# Patient Record
Sex: Female | Born: 1975
Health system: Southern US, Community
[De-identification: ages and names within clinical notes are randomized; demographics above are authoritative.]

## PROBLEM LIST (undated history)

## (undated) DIAGNOSIS — R109 Unspecified abdominal pain: Secondary | ICD-10-CM

## (undated) DIAGNOSIS — R5383 Other fatigue: Secondary | ICD-10-CM

## (undated) DIAGNOSIS — L578 Other skin changes due to chronic exposure to nonionizing radiation: Secondary | ICD-10-CM

## (undated) DIAGNOSIS — J45909 Unspecified asthma, uncomplicated: Secondary | ICD-10-CM

## (undated) DIAGNOSIS — F329 Major depressive disorder, single episode, unspecified: Secondary | ICD-10-CM

## (undated) DIAGNOSIS — Z0000 Encounter for general adult medical examination without abnormal findings: Secondary | ICD-10-CM

## (undated) DIAGNOSIS — F4321 Adjustment disorder with depressed mood: Secondary | ICD-10-CM

## (undated) DIAGNOSIS — E119 Type 2 diabetes mellitus without complications: Secondary | ICD-10-CM

## (undated) DIAGNOSIS — J392 Other diseases of pharynx: Secondary | ICD-10-CM

## (undated) DIAGNOSIS — B019 Varicella without complication: Secondary | ICD-10-CM

## (undated) DIAGNOSIS — E785 Hyperlipidemia, unspecified: Secondary | ICD-10-CM

## (undated) DIAGNOSIS — M4310 Spondylolisthesis, site unspecified: Secondary | ICD-10-CM

## (undated) DIAGNOSIS — F419 Anxiety disorder, unspecified: Secondary | ICD-10-CM

## (undated) DIAGNOSIS — T7840XA Allergy, unspecified, initial encounter: Secondary | ICD-10-CM

## (undated) DIAGNOSIS — G4700 Insomnia, unspecified: Secondary | ICD-10-CM

## (undated) DIAGNOSIS — R300 Dysuria: Secondary | ICD-10-CM

## (undated) DIAGNOSIS — Z124 Encounter for screening for malignant neoplasm of cervix: Secondary | ICD-10-CM

## (undated) HISTORY — DX: Other skin changes due to chronic exposure to nonionizing radiation: L57.8

## (undated) HISTORY — DX: Encounter for general adult medical examination without abnormal findings: Z00.00

## (undated) HISTORY — DX: Allergy, unspecified, initial encounter: T78.40XA

## (undated) HISTORY — DX: Major depressive disorder, single episode, unspecified: F32.9

## (undated) HISTORY — DX: Unspecified asthma, uncomplicated: J45.909

## (undated) HISTORY — DX: Dysuria: R30.0

## (undated) HISTORY — DX: Spondylolisthesis, site unspecified: M43.10

## (undated) HISTORY — DX: Type 2 diabetes mellitus without complications: E11.9

## (undated) HISTORY — DX: Other fatigue: R53.83

## (undated) HISTORY — DX: Varicella without complication: B01.9

## (undated) HISTORY — DX: Adjustment disorder with depressed mood: F43.21

## (undated) HISTORY — DX: Anxiety disorder, unspecified: F41.9

## (undated) HISTORY — DX: Other diseases of pharynx: J39.2

## (undated) HISTORY — DX: Encounter for screening for malignant neoplasm of cervix: Z12.4

## (undated) HISTORY — DX: Insomnia, unspecified: G47.00

## (undated) HISTORY — DX: Unspecified abdominal pain: R10.9

## (undated) HISTORY — DX: Hyperlipidemia, unspecified: E78.5

---

## 2007-02-02 HISTORY — PX: OTHER SURGICAL HISTORY: SHX169

## 2008-02-02 DIAGNOSIS — E119 Type 2 diabetes mellitus without complications: Secondary | ICD-10-CM

## 2008-02-02 HISTORY — DX: Type 2 diabetes mellitus without complications: E11.9

## 2008-09-01 HISTORY — PX: FOOT SURGERY: SHX648

## 2010-02-01 HISTORY — PX: DILATION AND CURETTAGE OF UTERUS: SHX78

## 2010-02-01 HISTORY — PX: OTHER SURGICAL HISTORY: SHX169

## 2013-12-06 ENCOUNTER — Ambulatory Visit (INDEPENDENT_AMBULATORY_CARE_PROVIDER_SITE_OTHER): Payer: 59 | Admitting: Family Medicine

## 2013-12-06 ENCOUNTER — Encounter: Payer: Self-pay | Admitting: Family Medicine

## 2013-12-06 DIAGNOSIS — G4700 Insomnia, unspecified: Secondary | ICD-10-CM

## 2013-12-06 DIAGNOSIS — R8290 Unspecified abnormal findings in urine: Secondary | ICD-10-CM

## 2013-12-06 DIAGNOSIS — B019 Varicella without complication: Secondary | ICD-10-CM | POA: Insufficient documentation

## 2013-12-06 DIAGNOSIS — Z0000 Encounter for general adult medical examination without abnormal findings: Secondary | ICD-10-CM

## 2013-12-06 DIAGNOSIS — E785 Hyperlipidemia, unspecified: Secondary | ICD-10-CM

## 2013-12-06 DIAGNOSIS — M4310 Spondylolisthesis, site unspecified: Secondary | ICD-10-CM

## 2013-12-06 DIAGNOSIS — R300 Dysuria: Secondary | ICD-10-CM

## 2013-12-06 DIAGNOSIS — J392 Other diseases of pharynx: Secondary | ICD-10-CM

## 2013-12-06 MED ORDER — HYOSCYAMINE SULFATE 0.125 MG SL SUBL: 0.1250 mg | SUBLINGUAL_TABLET | Freq: Four times a day (QID) | SUBLINGUAL | Status: DC | PRN

## 2013-12-06 MED ORDER — PARAGARD INTRAUTERINE COPPER IU IUD: 1.0000 | INTRAUTERINE_SYSTEM | Freq: Once | INTRAUTERINE | Status: DC

## 2013-12-06 MED ORDER — ALPRAZOLAM 0.25 MG PO TABS: 0.2500 mg | ORAL_TABLET | Freq: Two times a day (BID) | ORAL | Status: DC | PRN

## 2013-12-06 NOTE — Patient Instructions (Addendum)
Lactaid enzymes Probiotics daily [place] milk   Lexapro daily?  Curcumen/Turmeric caps daily Salon Pas/patches   Heartburn Heartburn is a painful, burning sensation in the chest. It may feel worse in certain positions, such as lying down or bending over. It is caused by stomach acid backing up into the tube that carries food from the mouth down to the stomach (lower esophagus).  CAUSES   Large meals.  Certain foods and drinks.  Exercise.  Increased acid production.  Being overweight or obese.  Certain medicines. SYMPTOMS   Burning pain in the chest or lower throat.  Bitter taste in the mouth.  Coughing. DIAGNOSIS  If the usual treatments for heartburn do not improve your symptoms, then tests may be done to see if there is another condition present. Possible tests may include:  X-rays.  Endoscopy. This is when a tube with a light and a camera on the end is used to examine the esophagus and the stomach.  A test to measure the amount of acid in the esophagus (pH test).  A test to see if the esophagus is working properly (esophageal manometry).  Blood, breath, or stool tests to check for bacteria that cause ulcers. TREATMENT   Your caregiver may tell you to use certain over-the-counter medicines (antacids, acid reducers) for mild heartburn.  Your caregiver may prescribe medicines to decrease the acid in your stomach or protect your stomach lining.  Your caregiver may recommend certain diet changes.  For severe cases, your caregiver may recommend that the head of your bed be elevated on blocks. (Sleeping with more pillows is not an effective treatment as it only changes the position of your head and does not improve the main problem of stomach acid refluxing into the esophagus.) HOME CARE INSTRUCTIONS   Take all medicines as directed by your caregiver.  Raise the head of your bed by putting blocks under the legs if instructed to by your caregiver.  Do not  exercise right after eating.  Avoid eating 2 or 3 hours before bed. Do not lie down right after eating.  Eat small meals throughout the day instead of 3 large meals.  Stop smoking if you smoke.  Maintain a healthy weight.  Identify foods and beverages that make your symptoms worse and avoid them. Foods you may want to avoid include:  Peppers.  Chocolate.  High-fat foods, including fried foods.  Spicy foods.  Garlic and onions.  Citrus fruits, including oranges, grapefruit, lemons, and limes.  Food containing tomatoes or tomato products.  Mint.  Carbonated drinks, caffeinated drinks, and alcohol.  Vinegar. SEEK IMMEDIATE MEDICAL CARE IF:  You have severe chest pain that goes down your arm or into your jaw or neck.  You feel sweaty, dizzy, or lightheaded.  You are short of breath.  You vomit blood.  You have difficulty or pain with swallowing.  You have bloody or black, tarry stools.  You have episodes of heartburn more than 3 times a week for more than 2 weeks. MAKE SURE YOU:  Understand these instructions.  Will watch your condition.  Will get help right away if you are not doing well or get worse. Document Released: 06/06/2008 Document Revised: 04/12/2011 Document Reviewed: 07/05/2010 [facility] Patient Information 2015 [place], [place]. This information is not intended to replace advice given to you by your health care provider. Make sure you discuss any questions you have with your health care provider.

## 2013-12-06 NOTE — Progress Notes (Signed)
Pre visit review using our clinic review tool, if applicable. No additional management support is needed unless otherwise documented below in the visit note. 

## 2013-12-07 LAB — URINALYSIS, ROUTINE W REFLEX MICROSCOPIC
Bilirubin Urine: NEGATIVE
KETONES UR: NEGATIVE
Nitrite: NEGATIVE
SPECIFIC GRAVITY, URINE: 1.01 (ref 1.000–1.030)
Total Protein, Urine: NEGATIVE
UROBILINOGEN UA: 0.2 (ref 0.0–1.0)
Urine Glucose: NEGATIVE
pH: 6 (ref 5.0–8.0)

## 2013-12-07 LAB — LIPID PANEL
CHOL/HDL RATIO: 3
Cholesterol: 192 mg/dL (ref 0–200)
HDL: 72.4 mg/dL (ref >39.00)
LDL CALC: 105 mg/dL — AB (ref 0–99)
NonHDL: 119.6
Triglycerides: 74 mg/dL (ref 0.0–149.0)
VLDL: 14.8 mg/dL (ref 0.0–40.0)

## 2013-12-07 LAB — HEPATIC FUNCTION PANEL
ALBUMIN: 4.1 g/dL (ref 3.5–5.2)
ALT: 18 U/L (ref 0–35)
AST: 25 U/L (ref 0–37)
Alkaline Phosphatase: 77 U/L (ref 39–117)
Bilirubin, Direct: 0.1 mg/dL (ref 0.0–0.3)
TOTAL PROTEIN: 7.8 g/dL (ref 6.0–8.3)
Total Bilirubin: 0.5 mg/dL (ref 0.2–1.2)

## 2013-12-07 LAB — CBC
HCT: 42.1 % (ref 36.0–46.0)
HEMOGLOBIN: 13.6 g/dL (ref 12.0–15.0)
MCHC: 32.4 g/dL (ref 30.0–36.0)
MCV: 91.3 fl (ref 78.0–100.0)
PLATELETS: 302 10*3/uL (ref 150.0–400.0)
RBC: 4.61 Mil/uL (ref 3.87–5.11)
RDW: 14.4 % (ref 11.5–15.5)
WBC: 7.5 10*3/uL (ref 4.0–10.5)

## 2013-12-07 LAB — RENAL FUNCTION PANEL
ALBUMIN: 4.1 g/dL (ref 3.5–5.2)
BUN: 15 mg/dL (ref 6–23)
CO2: 28 meq/L (ref 19–32)
Calcium: 9.5 mg/dL (ref 8.4–10.5)
Chloride: 105 mEq/L (ref 96–112)
Creatinine, Ser: 0.6 mg/dL (ref 0.4–1.2)
GFR: 128.93 mL/min (ref >60.00)
Glucose, Bld: 78 mg/dL (ref 70–99)
Phosphorus: 3.5 mg/dL (ref 2.3–4.6)
Potassium: 3.7 mEq/L (ref 3.5–5.1)
Sodium: 141 mEq/L (ref 135–145)

## 2013-12-07 LAB — TSH: TSH: 1.22 u[IU]/mL (ref 0.35–4.50)

## 2013-12-09 ENCOUNTER — Encounter: Payer: Self-pay | Admitting: Family Medicine

## 2013-12-09 DIAGNOSIS — R300 Dysuria: Secondary | ICD-10-CM

## 2013-12-09 DIAGNOSIS — J392 Other diseases of pharynx: Secondary | ICD-10-CM | POA: Insufficient documentation

## 2013-12-09 DIAGNOSIS — E785 Hyperlipidemia, unspecified: Secondary | ICD-10-CM

## 2013-12-09 DIAGNOSIS — G4700 Insomnia, unspecified: Secondary | ICD-10-CM

## 2013-12-09 DIAGNOSIS — M4310 Spondylolisthesis, site unspecified: Secondary | ICD-10-CM

## 2013-12-09 HISTORY — DX: Insomnia, unspecified: G47.00

## 2013-12-09 HISTORY — DX: Dysuria: R30.0

## 2013-12-09 HISTORY — DX: Other diseases of pharynx: J39.2

## 2013-12-09 HISTORY — DX: Hyperlipidemia, unspecified: E78.5

## 2013-12-09 HISTORY — DX: Spondylolisthesis, site unspecified: M43.10

## 2013-12-09 NOTE — Assessment & Plan Note (Signed)
Intermittent back pain, Encouraged moist heat and gentle stretching as tolerated. May try NSAIDs and prescription meds as directed and report if symptoms worsen or seek immediate care

## 2013-12-09 NOTE — Assessment & Plan Note (Signed)
Urinalysis questionable, will have her return to leave urine culture if still symptomatic. Encouraged probiotics and adequate hydration

## 2013-12-09 NOTE — Assessment & Plan Note (Signed)
Encouraged good sleep hygiene such as dark, quiet room. No blue/green glowing lights such as computer screens in bedroom. No alcohol or stimulants in evening. Cut down on caffeine as able. Regular exercise is helpful but not just prior to bed time. Has been using low dose Melatonin may try up to 10 mg prn

## 2013-12-09 NOTE — Assessment & Plan Note (Signed)
Encouraged heart healthy diet, increase exercise, avoid trans fats, consider a krill oil cap daily 

## 2013-12-09 NOTE — Progress Notes (Signed)
Patient ID: Cynthia Blanchard, female   DOB: September 13, 1975, 38 y.o.   MRN: [id number] Rejina Odle [id number] [birth year] 12/09/2013      Progress Note New Patient  Subjective  Chief Complaint  Chief Complaint  Patient presents with  . Establish Care    new patient    HPI  Patient is a 38 year old female in today for routine medical care. Patient is in today to establish care. She has several young kids at home and acknowledges a great deal of stress as a result. She works very infrequently as a [employer] PT. She is noting some trouble sleeping and ongoing back pain. No recent illness. Is noting some throat irritation. No reflux or allergic symptoms routinely. Denies CP/palp/SOB/HA/congestion/fevers/GI or GU c/o. Taking meds as prescribed  Past Medical History  Diagnosis Date  . Chicken pox as a child  . Asthma     as a child  . Diabetes mellitus without complication 7342    gestational diabetes- first pregnancy- not in the last 2 pregnancies  . Insomnia 12/09/2013  . Spondylolisthesis 12/09/2013  . Dysuria 12/09/2013  . Hyperlipidemia 12/09/2013  . Throat irritation 12/09/2013    Past Surgical History  Procedure Laterality Date  . Foot surgery  8-10    right- cyst- size of a golf ball  . Tube ressected  2009    right tube  . Tube removed  2012    right tube  . Dilation and curettage of uterus  2012    Family History  Problem Relation Age of Onset  . Hypertension Mother   . Obesity Father   . Depression Father   . Cancer Maternal Grandmother     skin  . Other Maternal Grandmother     valve replacement  . Cancer Maternal Grandfather     colon  . Diabetes Paternal Grandmother     type 2  . Stroke Paternal Grandmother   . Cancer Paternal Grandfather     prostate  . Mental illness Brother   . HIV Brother   . Drug abuse Brother     History   Social History  . Marital Status: Married    Spouse Name: N/A    Number of Children: N/A  . Years of Education: N/A   Occupational  History  . Not on file.   Social History Main Topics  . Smoking status: Never Smoker   . Smokeless tobacco: Never Used  . Alcohol Use: 0.0 oz/week    0 Not specified per week     Comment: glass of wine every night  . Drug Use: No  . Sexual Activity:    Partners: Male     Comment: live with husband and 3 children. 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 Not on file   Social History Narrative    No current outpatient prescriptions on file prior to visit.   No current facility-administered medications on file prior to visit.    Allergies  Allergen Reactions  . Penicillins Hives    Review of Systems  Review of Systems  Constitutional: Negative for fever, chills and malaise/fatigue.  HENT: Negative for congestion, hearing loss and nosebleeds.   Eyes: Negative for discharge.  Respiratory: Negative for cough, sputum production, shortness of breath and wheezing.   Cardiovascular: Negative for chest pain, palpitations and leg swelling.  Gastrointestinal: Negative for heartburn, nausea, vomiting, abdominal pain, diarrhea, constipation and blood in stool.  Genitourinary: Negative for dysuria, urgency,  frequency and hematuria.  Musculoskeletal: Negative for myalgias, back pain and falls.  Skin: Negative for rash.  Neurological: Negative for dizziness, tremors, sensory change, focal weakness, loss of consciousness, weakness and headaches.  Endo/Heme/Allergies: Negative for polydipsia. Does not bruise/bleed easily.  Psychiatric/Behavioral: Negative for depression and suicidal ideas. The patient is not nervous/anxious and does not have insomnia.     Objective  BP 133/76 mmHg  Pulse 69  Temp(Src) 98 F (36.7 C) (Oral)  Ht 5' 6.5" (1.689 m)  Wt 141 lb (63.957 kg)  BMI 22.42 kg/m2  SpO2 98%  LMP 01/16/2012  Physical Exam  Physical Exam  Constitutional: She is oriented to person, place, and time and well-developed, well-nourished,  and in no distress. No distress.  HENT:  Head: Normocephalic and atraumatic.  Right Ear: External ear normal.  Left Ear: External ear normal.  Nose: Nose normal.  Mouth/Throat: Oropharynx is clear and moist. No oropharyngeal exudate.  Eyes: Conjunctivae are normal. Pupils are equal, round, and reactive to light. Right eye exhibits no discharge. Left eye exhibits no discharge. No scleral icterus.  Neck: Normal range of motion. Neck supple. No thyromegaly present.  Cardiovascular: Normal rate, regular rhythm, normal heart sounds and intact distal pulses.   No murmur heard. Pulmonary/Chest: Effort normal and breath sounds normal. No respiratory distress. She has no wheezes. She has no rales.  Abdominal: Soft. Bowel sounds are normal. She exhibits no distension and no mass. There is no tenderness.  Musculoskeletal: Normal range of motion. She exhibits no edema or tenderness.  Lymphadenopathy:    She has no cervical adenopathy.  Neurological: She is alert and oriented to person, place, and time. She has normal reflexes. No cranial nerve deficit. Coordination normal.  Skin: Skin is warm and dry. No rash noted. She is not diaphoretic.  Psychiatric: Mood, memory and affect normal.       Assessment & Plan  Spondylolisthesis Intermittent back pain, Encouraged moist heat and gentle stretching as tolerated. May try NSAIDs and prescription meds as directed and report if symptoms worsen or seek immediate care  Hyperlipidemia Encouraged heart healthy diet, increase exercise, avoid trans fats, consider a krill oil cap daily  Insomnia Encouraged good sleep hygiene such as dark, quiet room. No blue/green glowing lights such as computer screens in bedroom. No alcohol or stimulants in evening. Cut down on caffeine as able. Regular exercise is helpful but not just prior to bed time. Has been using low dose Melatonin may try up to 10 mg prn  Dysuria Urinalysis questionable, will have her return to  leave urine culture if still symptomatic. Encouraged probiotics and adequate hydration  Throat irritation Likely multifactorial. Encouraged to consider antihistamines and H2 blocker. Avoid offending foods and report if worse, start a probiotics

## 2013-12-09 NOTE — Assessment & Plan Note (Signed)
Likely multifactorial. Encouraged to consider antihistamines and H2 blocker. Avoid offending foods and report if worse, start a probiotics

## 2013-12-10 NOTE — Addendum Note (Signed)
Addended by: Varney Daily on: 12/10/2013 12:02 PM   Modules accepted: Orders

## 2014-03-08 ENCOUNTER — Ambulatory Visit (INDEPENDENT_AMBULATORY_CARE_PROVIDER_SITE_OTHER): Payer: 59 | Admitting: Family Medicine

## 2014-03-08 ENCOUNTER — Other Ambulatory Visit (HOSPITAL_COMMUNITY)
Admission: RE | Admit: 2014-03-08 | Discharge: 2014-03-08 | Disposition: A | Discharge status: Home / Self Care | Payer: 59 | Source: Ambulatory Visit | Attending: Family Medicine | Admitting: Family Medicine

## 2014-03-08 ENCOUNTER — Encounter: Payer: Self-pay | Admitting: Family Medicine

## 2014-03-08 VITALS — BP 108/67 | HR 57 | Temp 98.0°F | Ht 66.5 in | Wt 137.8 lb

## 2014-03-08 DIAGNOSIS — E785 Hyperlipidemia, unspecified: Secondary | ICD-10-CM

## 2014-03-08 DIAGNOSIS — N760 Acute vaginitis: Secondary | ICD-10-CM

## 2014-03-08 DIAGNOSIS — G4700 Insomnia, unspecified: Secondary | ICD-10-CM

## 2014-03-08 DIAGNOSIS — N390 Urinary tract infection, site not specified: Secondary | ICD-10-CM

## 2014-03-08 MED ORDER — HYOSCYAMINE SULFATE 0.125 MG SL SUBL: 0.1250 mg | SUBLINGUAL_TABLET | Freq: Four times a day (QID) | SUBLINGUAL | Status: DC | PRN

## 2014-03-08 NOTE — Progress Notes (Signed)
Pre visit review using our clinic review tool, if applicable. No additional management support is needed unless otherwise documented below in the visit note. 

## 2014-03-09 LAB — URINALYSIS
Bilirubin Urine: NEGATIVE
Glucose, UA: NEGATIVE mg/dL
HGB URINE DIPSTICK: NEGATIVE
KETONES UR: NEGATIVE mg/dL
LEUKOCYTES UA: NEGATIVE
NITRITE: NEGATIVE
PROTEIN: NEGATIVE mg/dL
Specific Gravity, Urine: 1.01 (ref 1.005–1.030)
Urobilinogen, UA: 0.2 mg/dL (ref 0.0–1.0)
pH: 7 (ref 5.0–8.0)

## 2014-03-10 LAB — URINE CULTURE: Colony Count: 2000

## 2014-03-13 LAB — URINE CYTOLOGY ANCILLARY ONLY
Bacterial vaginitis: NEGATIVE
Candida vaginitis: NEGATIVE

## 2014-03-17 ENCOUNTER — Encounter: Payer: Self-pay | Admitting: Family Medicine

## 2014-03-17 NOTE — Assessment & Plan Note (Signed)
Encouraged heart healthy diet, increase exercise, avoid trans fats, consider a krill oil cap daily 

## 2014-03-17 NOTE — Progress Notes (Signed)
Cynthia Blanchard  [id number] 1975/03/03 03/17/2014      Progress Note-Follow Up  Subjective  Chief Complaint  Chief Complaint  Patient presents with  . Follow-up    HPI  Patient is a 39 y.o. female in today for routine medical care. Patient is in today for follow-up generally doing well. Uses alprazolam intermittently for sleep but otherwise has not needed it. No recent significant anxiety or depression. No recent illness. Denies CP/palp/SOB/HA/congestion/fevers/GI or GU c/o. Taking meds as prescribed  Past Medical History  Diagnosis Date  . Chicken pox as a child  . Asthma     as a child  . Diabetes mellitus without complication 0932    gestational diabetes- first pregnancy- not in the last 2 pregnancies  . Insomnia 12/09/2013  . Spondylolisthesis 12/09/2013  . Dysuria 12/09/2013  . Hyperlipidemia 12/09/2013  . Throat irritation 12/09/2013    Past Surgical History  Procedure Laterality Date  . Foot surgery  8-10    right- cyst- size of a golf ball  . Tube ressected  2009    right tube  . Tube removed  2012    right tube  . Dilation and curettage of uterus  2012    Family History  Problem Relation Age of Onset  . Hypertension Mother   . Obesity Father   . Depression Father   . Cancer Maternal Grandmother     skin  . Other Maternal Grandmother     valve replacement  . Cancer Maternal Grandfather     colon  . Diabetes Paternal Grandmother     type 2  . Stroke Paternal Grandmother   . Cancer Paternal Grandfather     prostate  . Mental illness Brother   . HIV Brother   . Drug abuse Brother     History   Social History  . Marital Status: Married    Spouse Name: N/A  . Number of Children: N/A  . Years of Education: N/A   Occupational History  . Not on file.   Social History Main Topics  . Smoking status: Never Smoker   . Smokeless tobacco: Never Used  . Alcohol Use: 0.0 oz/week    0 Standard drinks or equivalent per week     Comment: glass of wine  every night  . Drug Use: No  . Sexual Activity:    Partners: Male     Comment: live with husband and 3 children. 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 Not on file   Social History Narrative    Current Outpatient Prescriptions on File Prior to Visit  Medication Sig Dispense Refill  . ALPRAZolam (XANAX) 0.25 MG tablet Take 1 tablet (0.25 mg total) by mouth 2 (two) times daily as needed for anxiety. 20 tablet 2  . PARAGARD INTRAUTERINE COPPER IUD IUD 1 each by Intrauterine route once. November 2014  0  . Probiotic Product (PROBIOTIC DAILY PO) Take by mouth.     No current facility-administered medications on file prior to visit.    Allergies  Allergen Reactions  . Latex Hives  . Penicillins Hives    Review of Systems  Review of Systems  Constitutional: Negative for fever and malaise/fatigue.  HENT: Negative for congestion.   Eyes: Negative for discharge.  Respiratory: Negative for shortness of breath.   Cardiovascular: Negative for chest pain, palpitations and leg swelling.  Gastrointestinal: Negative for nausea, abdominal pain and diarrhea.  Genitourinary: Negative for dysuria.  Musculoskeletal: Negative for falls.  Skin: Negative for rash.  Neurological: Negative for loss of consciousness and headaches.  Endo/Heme/Allergies: Negative for polydipsia.  Psychiatric/Behavioral: Negative for depression and suicidal ideas. The patient is not nervous/anxious and does not have insomnia.     Objective  BP 108/67 mmHg  Pulse 57  Temp(Src) 98 F (36.7 C) (Oral)  Ht 5' 6.5" (1.689 m)  Wt 137 lb 12.8 oz (62.506 kg)  BMI 21.91 kg/m2  SpO2 99%  Physical Exam  Physical Exam  Constitutional: She is oriented to person, place, and time and well-developed, well-nourished, and in no distress. No distress.  HENT:  Head: Normocephalic and atraumatic.  Eyes: Conjunctivae are normal.  Neck: Neck supple. No thyromegaly present.    Cardiovascular: Normal rate, regular rhythm and normal heart sounds.   No murmur heard. Pulmonary/Chest: Effort normal and breath sounds normal. She has no wheezes.  Abdominal: She exhibits no distension and no mass.  Musculoskeletal: She exhibits no edema.  Lymphadenopathy:    She has no cervical adenopathy.  Neurological: She is alert and oriented to person, place, and time.  Skin: Skin is warm and dry. No rash noted. She is not diaphoretic.  Psychiatric: Memory, affect and judgment normal.    Lab Results  Component Value Date   TSH 1.22 12/06/2013   Lab Results  Component Value Date   WBC 7.5 12/06/2013   HGB 13.6 12/06/2013   HCT 42.1 12/06/2013   MCV 91.3 12/06/2013   PLT 302.0 12/06/2013   Lab Results  Component Value Date   CREATININE 0.6 12/06/2013   BUN 15 12/06/2013   NA 141 12/06/2013   K 3.7 12/06/2013   CL 105 12/06/2013   CO2 28 12/06/2013   Lab Results  Component Value Date   ALT 18 12/06/2013   AST 25 12/06/2013   ALKPHOS 77 12/06/2013   BILITOT 0.5 12/06/2013   Lab Results  Component Value Date   CHOL 192 12/06/2013   Lab Results  Component Value Date   HDL 72.40 12/06/2013   Lab Results  Component Value Date   LDLCALC 105* 12/06/2013   Lab Results  Component Value Date   TRIG 74.0 12/06/2013   Lab Results  Component Value Date   CHOLHDL 3 12/06/2013     Assessment & Plan  Insomnia Using Alprazolam prn may continue the same. Encouraged good sleep hygiene such as dark, quiet room. No blue/green glowing lights such as computer screens in bedroom. No alcohol or stimulants in evening. Cut down on caffeine as able. Regular exercise is helpful but not just prior to bed time.    Hyperlipidemia Encouraged heart healthy diet, increase exercise, avoid trans fats, consider a krill oil cap daily

## 2014-03-17 NOTE — Assessment & Plan Note (Signed)
Using Alprazolam prn may continue the same. Encouraged good sleep hygiene such as dark, quiet room. No blue/green glowing lights such as computer screens in bedroom. No alcohol or stimulants in evening. Cut down on caffeine as able. Regular exercise is helpful but not just prior to bed time.

## 2014-08-13 ENCOUNTER — Telehealth: Payer: Self-pay | Admitting: Family Medicine

## 2014-08-13 NOTE — Telephone Encounter (Signed)
pre visit letter mailed 08/13/14

## 2014-08-14 ENCOUNTER — Encounter: Payer: Self-pay | Admitting: Family Medicine

## 2014-08-14 ENCOUNTER — Encounter: Payer: Self-pay | Admitting: *Deleted

## 2014-08-14 ENCOUNTER — Ambulatory Visit (INDEPENDENT_AMBULATORY_CARE_PROVIDER_SITE_OTHER): Payer: Commercial Managed Care - HMO | Admitting: Family Medicine

## 2014-08-14 VITALS — BP 113/77 | HR 93 | Temp 102.3°F | Resp 16 | Ht 66.5 in | Wt 136.0 lb

## 2014-08-14 DIAGNOSIS — R509 Fever, unspecified: Secondary | ICD-10-CM

## 2014-08-14 DIAGNOSIS — J989 Respiratory disorder, unspecified: Secondary | ICD-10-CM | POA: Diagnosis not present

## 2014-08-14 LAB — POCT RAPID STREP A (OFFICE): Rapid Strep A Screen: NEGATIVE

## 2014-08-14 MED ORDER — HYDROCODONE-HOMATROPINE 5-1.5 MG/5ML PO SYRP
ORAL_SOLUTION | ORAL | Status: DC
Start: 2014-08-14 — End: 2014-12-25

## 2014-08-14 MED ORDER — MAGIC MOUTHWASH: 5.0000 mL | Freq: Four times a day (QID) | ORAL | Status: DC | PRN

## 2014-08-14 MED ORDER — CLARITHROMYCIN 500 MG PO TABS: ORAL_TABLET | ORAL | Status: DC

## 2014-08-14 NOTE — Progress Notes (Signed)
Pre visit review using our clinic review tool, if applicable. No additional management support is needed unless otherwise documented below in the visit note. 

## 2014-08-14 NOTE — Addendum Note (Signed)
Addended by: Tammi Sou on: 08/14/2014 02:36 PM   Modules accepted: Orders

## 2014-08-14 NOTE — Progress Notes (Signed)
OFFICE VISIT  08/14/2014   CC:  Chief Complaint  Patient presents with  . URI    x 3 days   HPI:    Patient is a 39 y.o. Caucasian female who presents for fever. Onset abruptly 4d/a of fever, HA, neck and upper shoulders feel painful/burning, productive cough, nausea, fatigue/bloated.  No SOB or vomiting. PO intake was poor but picking up today. No rash. Has 3 small kids, 2 of whom had fever/URI/cough recently.   Past Medical History  Diagnosis Date  . Chicken pox as a child  . Asthma     as a child  . Diabetes mellitus without complication 9150    gestational diabetes- first pregnancy- not in the last 2 pregnancies  . Insomnia 12/09/2013  . Spondylolisthesis 12/09/2013  . Dysuria 12/09/2013  . Hyperlipidemia 12/09/2013  . Throat irritation 12/09/2013    Past Surgical History  Procedure Laterality Date  . Foot surgery  8-10    right- cyst- size of a golf ball  . Tube ressected  2009    right tube  . Tube removed  2012    right tube  . Dilation and curettage of uterus  2012    Outpatient Prescriptions Prior to Visit  Medication Sig Dispense Refill  . ALPRAZolam (XANAX) 0.25 MG tablet Take 1 tablet (0.25 mg total) by mouth 2 (two) times daily as needed for anxiety. 20 tablet 2  . hyoscyamine (LEVSIN SL) 0.125 MG SL tablet Place 1 tablet (0.125 mg total) under the tongue every 6 (six) hours as needed. 30 tablet 1  . Probiotic Product (PROBIOTIC DAILY PO) Take by mouth.    Marland Kitchen PARAGARD INTRAUTERINE COPPER IUD IUD 1 each by Intrauterine route once. November 2014 (Patient not taking: Reported on 08/14/2014)  0   No facility-administered medications prior to visit.    Allergies  Allergen Reactions  . Latex Hives  . Penicillins Hives    ROS As per HPI  PE: Blood pressure 113/77, pulse 93, temperature 102.3 F (39.1 C), temperature source Oral, resp. rate 16, height 5' 6.5" (1.689 m), weight 136 lb (61.689 kg), SpO2 95 %. Alert, tired appearing but nontoxic.  Oriented  x 4. HEENT: eyes without injection, drainage, or swelling.  Ears: EACs clear, TMs with normal light reflex and landmarks.  Nose: Clear rhinorrhea, with some dried, crusty exudate adherent to mildly injected mucosa.  No purulent d/c.  No paranasal sinus TTP.  No facial swelling.  Throat and mouth without focal lesion.  No pharyngial swelling, erythema, or exudate.   Neck: supple, no LAD.   LUNGS: CTA bilat, nonlabored resps.   CV: RRR, no m/r/g. EXT: no c/c/e SKIN: no rash  LABS:  Rapid strep NEG  IMPRESSION AND PLAN:  Febrile respiratory illness; likely viral but fevers lasting 4d. 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 Hycodan syrup 1-2 tsp bid prn cough, 240 ml. Robitussin DM during daytime. Clarithromycin 500 mg bid x 10d.  An After Visit Summary was printed and given to the patient.  FOLLOW UP: prn

## 2014-08-16 ENCOUNTER — Telehealth: Payer: Self-pay | Admitting: Family Medicine

## 2014-08-16 NOTE — Telephone Encounter (Signed)
Patient states she has a rash on her neck now that is shaped like a ring. She is wondering if she has lymes disease. Patient requesting a CB.

## 2014-08-16 NOTE — Telephone Encounter (Signed)
Pt advised and voiced understanding.  Will call Monday if no improvement to schedule ov.

## 2014-08-16 NOTE — Telephone Encounter (Signed)
Left message to call back  

## 2014-08-16 NOTE — Telephone Encounter (Signed)
Spoke to pt and she states that she noticed what looked like a insect bite on the back of her neck about 11 days ago. She stated that since then it has changed and is now like a red ring with white center, not itchy but scaly. Please advise. Thanks.

## 2014-08-16 NOTE — Telephone Encounter (Signed)
Sorry, have to see her in office in order to be able to tell what this is and what to do about it. She also has option of going to an urgent care or to our Saturday clinic at [place] 9-1 PM.-thx

## 2014-08-19 ENCOUNTER — Encounter: Payer: Self-pay | Admitting: Family Medicine

## 2014-08-19 ENCOUNTER — Ambulatory Visit (INDEPENDENT_AMBULATORY_CARE_PROVIDER_SITE_OTHER): Payer: Commercial Managed Care - HMO | Admitting: Family Medicine

## 2014-08-19 VITALS — BP 120/77 | HR 81 | Temp 97.9°F | Resp 16 | Ht 66.5 in | Wt 136.0 lb

## 2014-08-19 DIAGNOSIS — B354 Tinea corporis: Secondary | ICD-10-CM

## 2014-08-19 DIAGNOSIS — J208 Acute bronchitis due to other specified organisms: Secondary | ICD-10-CM | POA: Diagnosis not present

## 2014-08-19 NOTE — Progress Notes (Signed)
Pre visit review using our clinic review tool, if applicable. No additional management support is needed unless otherwise documented below in the visit note. 

## 2014-08-19 NOTE — Progress Notes (Signed)
OFFICE NOTE  08/19/2014  CC:  Chief Complaint  Patient presents with  . Rash  . Fatigue     HPI: Patient is a 39 y.o. Caucasian female who is here for concern about a rash on back of neck that she is concerned may be lyme dz.  She noted it start as a raised red bump, now is a circle with flaky borders and central clearing.  No itching or pain.  No tick bite was noted. Her recent febrile resp illness is improved by about 50% per pt today.  Still with some rattly cough, some "fits" of coughing.  No more fevers, and her HA's and myalgias in shoulders/neck area are improving.  Pertinent PMH:  Past medical, surgical, social, and family history reviewed and no changes are noted since last office visit.  MEDS:  Outpatient Prescriptions Prior to Visit  Medication Sig Dispense Refill  . ALPRAZolam (XANAX) 0.25 MG tablet Take 1 tablet (0.25 mg total) by mouth 2 (two) times daily as needed for anxiety. 20 tablet 2  . Alum & Mag Hydroxide-Simeth (MAGIC MOUTHWASH) SOLN Take 5 mLs by mouth 4 (four) times daily as needed for mouth pain. 100 mL 0  . clarithromycin (BIAXIN) 500 MG tablet 1 tab po bid x 10d 20 tablet 0  . HYDROcodone-homatropine (HYCODAN) 5-1.5 MG/5ML syrup 1-2 tsp po bid prn cough 240 mL 0  . hyoscyamine (LEVSIN SL) 0.125 MG SL tablet Place 1 tablet (0.125 mg total) under the tongue every 6 (six) hours as needed. 30 tablet 1  . Probiotic Product (PROBIOTIC DAILY PO) Take by mouth.     No facility-administered medications prior to visit.    PE: Blood pressure 120/77, pulse 81, temperature 97.9 F (36.6 C), temperature source Oral, resp. rate 16, height 5' 6.5" (1.689 m), weight 136 lb (61.689 kg), last menstrual period 07/27/2014, SpO2 99 %. Gen: Alert, well appearing.  Patient is oriented to person, place, time, and situation. SKIN: left post neck with nickel-sized pink oval with distinct flaky, raised border and central clearing noted. No macular rash around this.   CV: RRR, no  m/r/g.   LUNGS: CTA bilat, nonlabored resps, good aeration in all lung fields.   IMPRESSION AND PLAN:  1) Recent febrile resp illness, acute bronchitis: improving on clarithromycin and symptomatic care. Continue current meds.  Offered systemic steroid today but given her improvement at this juncture we decided to hold off right now, but if prolonged sx's occur then she'll call or return.  2) Tinea corporis: reassured pt that she does not have lyme dz. Apply lamisil cream OTC bid.  An After Visit Summary was printed and given to the patient.  FOLLOW UP: prn

## 2014-09-02 ENCOUNTER — Telehealth: Payer: Self-pay | Admitting: *Deleted

## 2014-09-02 ENCOUNTER — Encounter: Payer: Self-pay | Admitting: *Deleted

## 2014-09-02 NOTE — Telephone Encounter (Signed)
Pre-Visit Call completed with patient and chart updated.   Pre-Visit Info documented in Specialty Comments under [employer].    

## 2014-09-02 NOTE — Telephone Encounter (Signed)
Unable to reach patient at time of Pre-Visit Call.  Left message for patient to return call when available.    

## 2014-09-02 NOTE — Addendum Note (Signed)
Addended by: Leticia Penna A on: 09/02/2014 02:14 PM   Modules accepted: Orders, Medications

## 2014-09-03 ENCOUNTER — Encounter: Payer: Self-pay | Admitting: Family Medicine

## 2014-09-03 ENCOUNTER — Ambulatory Visit (INDEPENDENT_AMBULATORY_CARE_PROVIDER_SITE_OTHER): Payer: Commercial Managed Care - HMO | Admitting: Family Medicine

## 2014-09-03 ENCOUNTER — Other Ambulatory Visit (HOSPITAL_COMMUNITY)
Admission: RE | Admit: 2014-09-03 | Discharge: 2014-09-03 | Disposition: A | Discharge status: Home / Self Care | Payer: Commercial Managed Care - HMO | Source: Ambulatory Visit | Attending: Family Medicine | Admitting: Family Medicine

## 2014-09-03 VITALS — BP 92/62 | HR 74 | Temp 97.8°F | Ht 67.0 in | Wt 131.5 lb

## 2014-09-03 DIAGNOSIS — L578 Other skin changes due to chronic exposure to nonionizing radiation: Secondary | ICD-10-CM

## 2014-09-03 DIAGNOSIS — Z124 Encounter for screening for malignant neoplasm of cervix: Secondary | ICD-10-CM

## 2014-09-03 DIAGNOSIS — R109 Unspecified abdominal pain: Secondary | ICD-10-CM

## 2014-09-03 DIAGNOSIS — Z0000 Encounter for general adult medical examination without abnormal findings: Secondary | ICD-10-CM

## 2014-09-03 DIAGNOSIS — N760 Acute vaginitis: Secondary | ICD-10-CM

## 2014-09-03 DIAGNOSIS — E782 Mixed hyperlipidemia: Secondary | ICD-10-CM | POA: Diagnosis not present

## 2014-09-03 DIAGNOSIS — R51 Headache: Secondary | ICD-10-CM | POA: Diagnosis not present

## 2014-09-03 DIAGNOSIS — Z01419 Encounter for gynecological examination (general) (routine) without abnormal findings: Secondary | ICD-10-CM | POA: Diagnosis present

## 2014-09-03 DIAGNOSIS — R519 Headache, unspecified: Secondary | ICD-10-CM

## 2014-09-03 DIAGNOSIS — E785 Hyperlipidemia, unspecified: Secondary | ICD-10-CM

## 2014-09-03 DIAGNOSIS — G4700 Insomnia, unspecified: Secondary | ICD-10-CM

## 2014-09-03 HISTORY — DX: Encounter for screening for malignant neoplasm of cervix: Z12.4

## 2014-09-03 HISTORY — DX: Encounter for general adult medical examination without abnormal findings: Z00.00

## 2014-09-03 LAB — COMPREHENSIVE METABOLIC PANEL
ALBUMIN: 4.3 g/dL (ref 3.5–5.2)
ALT: 16 U/L (ref 0–35)
AST: 20 U/L (ref 0–37)
Alkaline Phosphatase: 58 U/L (ref 39–117)
BUN: 13 mg/dL (ref 6–23)
CALCIUM: 8.9 mg/dL (ref 8.4–10.5)
CHLORIDE: 103 meq/L (ref 96–112)
CO2: 32 meq/L (ref 19–32)
Creatinine, Ser: 0.6 mg/dL (ref 0.40–1.20)
GFR: 118.59 mL/min (ref >60.00)
Glucose, Bld: 78 mg/dL (ref 70–99)
Potassium: 3.6 mEq/L (ref 3.5–5.1)
Sodium: 140 mEq/L (ref 135–145)
TOTAL PROTEIN: 7 g/dL (ref 6.0–8.3)
Total Bilirubin: 0.5 mg/dL (ref 0.2–1.2)

## 2014-09-03 LAB — CBC
HCT: 38 % (ref 36.0–46.0)
Hemoglobin: 12.4 g/dL (ref 12.0–15.0)
MCHC: 32.7 g/dL (ref 30.0–36.0)
MCV: 89.1 fl (ref 78.0–100.0)
PLATELETS: 335 10*3/uL (ref 150.0–400.0)
RBC: 4.26 Mil/uL (ref 3.87–5.11)
RDW: 14.6 % (ref 11.5–15.5)
WBC: 7.7 10*3/uL (ref 4.0–10.5)

## 2014-09-03 LAB — LIPID PANEL
Cholesterol: 158 mg/dL (ref 0–200)
HDL: 59 mg/dL (ref >39.00)
LDL Cholesterol: 81 mg/dL (ref 0–99)
NonHDL: 98.56
Total CHOL/HDL Ratio: 3
Triglycerides: 89 mg/dL (ref 0.0–149.0)
VLDL: 17.8 mg/dL (ref 0.0–40.0)

## 2014-09-03 LAB — TSH: TSH: 0.93 u[IU]/mL (ref 0.35–4.50)

## 2014-09-03 NOTE — Progress Notes (Signed)
Raylei Losurdo  [id number] 1975/02/12 09/03/2014      Progress Note-Follow Up  Subjective  Chief Complaint  Chief Complaint  Patient presents with  . Annual Exam    HPI  Patient is a 39 y.o. female in today for routine medical care. Patient is in today for annual exam and fee Had an episode of feeling shaky and sweaty last week after skipping a meal. This has not been recurrent. She has occasionally had sharp abdominal pains since last visit but has responded to Hyoscyamine No bloody or tarry stool. Denies CP/palp/SOB/HA/congestion/fevers/GI or GU c/o. Taking meds as prescribed  Past Medical History  Diagnosis Date  . Chicken pox as a child  . Asthma     as a child  . Diabetes mellitus without complication 9798    gestational diabetes- first pregnancy- not in the last 2 pregnancies  . Insomnia 12/09/2013  . Spondylolisthesis 12/09/2013  . Dysuria 12/09/2013  . Hyperlipidemia 12/09/2013  . Throat irritation 12/09/2013    Past Surgical History  Procedure Laterality Date  . Foot surgery  8-10    right- cyst- size of a golf ball  . Tube ressected  2009    right tube  . Tube removed  2012    right tube  . Dilation and curettage of uterus  2012    Family History  Problem Relation Age of Onset  . Hypertension Mother   . Obesity Father   . Depression Father   . Cancer Maternal Grandmother     skin  . Other Maternal Grandmother     valve replacement  . Cancer Maternal Grandfather     colon  . Diabetes Paternal Grandmother     type 2  . Stroke Paternal Grandmother   . Cancer Paternal Grandfather     prostate  . Mental illness Brother   . HIV Brother   . Drug abuse Brother     History   Social History  . Marital Status: Married    Spouse Name: N/A  . Number of Children: N/A  . Years of Education: N/A   Occupational History  . Not on file.   Social History Main Topics  . Smoking status: Never Smoker   . Smokeless tobacco: Never Used  . Alcohol Use: 0.0  oz/week    0 Standard drinks or equivalent per week     Comment: glass of wine every night  . Drug Use: No  . Sexual Activity:    Partners: Male     Comment: live with husband and 3 children. 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 Not on file   Social History Narrative    Current Outpatient Prescriptions on File Prior to Visit  Medication Sig Dispense Refill  . ALPRAZolam (XANAX) 0.25 MG tablet Take 1 tablet (0.25 mg total) by mouth 2 (two) times daily as needed for anxiety. 20 tablet 2  . hyoscyamine (LEVSIN SL) 0.125 MG SL tablet Place 1 tablet (0.125 mg total) under the tongue every 6 (six) hours as needed. 30 tablet 1  . Probiotic Product (PROBIOTIC DAILY PO) Take by mouth.    . Alum & Mag Hydroxide-Simeth (MAGIC MOUTHWASH) SOLN Take 5 mLs by mouth 4 (four) times daily as needed for mouth pain. (Patient not taking: Reported on 09/03/2014) 100 mL 0  . HYDROcodone-homatropine (HYCODAN) 5-1.5 MG/5ML syrup 1-2 tsp po bid prn cough (Patient not taking: Reported on 09/03/2014) 240 mL 0   No  current facility-administered medications on file prior to visit.    Allergies  Allergen Reactions  . Latex Hives  . Penicillins Hives    Allergies verified: UTD   Immunization Status: Flu vaccine-- 11/01/13  Tdap-- 2010 per patient  PNA-- NA  Shingles-- NA   A/P:  Changes to FH, PSH or Personal Hx: UTD  Pap-- "many years ago" - would like at appointment  MMG-- NA  Bone Density-- NA  CCS-- NA  Review of Systems  Review of Systems  Constitutional: Negative for fever and malaise/fatigue.  HENT: Negative for congestion.   Eyes: Negative for discharge.  Respiratory: Negative for shortness of breath.   Cardiovascular: Negative for chest pain, palpitations and leg swelling.  Gastrointestinal: Positive for abdominal pain. Negative for nausea and diarrhea.       Good response to Hyoscyamine when needed  Genitourinary: Negative for dysuria.    Musculoskeletal: Negative for falls.  Skin: Negative for rash.       Verrucous lesion on foot  Neurological: Positive for tremors and seizures. Negative for loss of consciousness and headaches.       Had an episode of feeling shakey, sweaty last week after she skipped a meal.   Endo/Heme/Allergies: Negative for polydipsia.  Psychiatric/Behavioral: Negative for depression and suicidal ideas. The patient is not nervous/anxious and does not have insomnia.     Objective  BP 92/62 mmHg  Pulse 74  Temp(Src) 97.8 F (36.6 C) (Oral)  Ht 5' 7"  (1.702 m)  Wt 131 lb 8 oz (59.648 kg)  BMI 20.59 kg/m2  SpO2 98%  LMP 08/26/2014  Physical Exam  Physical Exam  Constitutional: She is oriented to person, place, and time and well-developed, well-nourished, and in no distress. No distress.  HENT:  Head: Normocephalic and atraumatic.  Right Ear: External ear normal.  Left Ear: External ear normal.  Nose: Nose normal.  Mouth/Throat: Oropharynx is clear and moist. No oropharyngeal exudate.  Eyes: Conjunctivae are normal. Pupils are equal, round, and reactive to light. Right eye exhibits no discharge. Left eye exhibits no discharge. No scleral icterus.  Neck: Normal range of motion. Neck supple. No thyromegaly present.  Cardiovascular: Normal rate, regular rhythm, normal heart sounds and intact distal pulses.   No murmur heard. Pulmonary/Chest: Effort normal and breath sounds normal. No respiratory distress. She has no wheezes. She has no rales.  Abdominal: Soft. Bowel sounds are normal. She exhibits no distension and no mass. There is no tenderness.  Musculoskeletal: Normal range of motion. She exhibits no edema or tenderness.  Lymphadenopathy:    She has no cervical adenopathy.  Neurological: She is alert and oriented to person, place, and time. She has normal reflexes. No cranial nerve deficit. Coordination normal.  Skin: Skin is warm and dry. No rash noted. She is not diaphoretic.   Psychiatric: Mood, memory and affect normal.    Lab Results  Component Value Date   TSH 1.22 12/06/2013   Lab Results  Component Value Date   WBC 7.5 12/06/2013   HGB 13.6 12/06/2013   HCT 42.1 12/06/2013   MCV 91.3 12/06/2013   PLT 302.0 12/06/2013   Lab Results  Component Value Date   CREATININE 0.6 12/06/2013   BUN 15 12/06/2013   NA 141 12/06/2013   K 3.7 12/06/2013   CL 105 12/06/2013   CO2 28 12/06/2013   Lab Results  Component Value Date   ALT 18 12/06/2013   AST 25 12/06/2013   ALKPHOS 77 12/06/2013   BILITOT  0.5 12/06/2013   Lab Results  Component Value Date   CHOL 192 12/06/2013   Lab Results  Component Value Date   HDL 72.40 12/06/2013   Lab Results  Component Value Date   LDLCALC 105* 12/06/2013   Lab Results  Component Value Date   TRIG 74.0 12/06/2013   Lab Results  Component Value Date   CHOLHDL 3 12/06/2013     Assessment & Plan  Preventative health care Patient encouraged to maintain heart healthy diet, regular exercise, adequate sleep. Consider daily probiotics. Take medications as prescribed. Labs ordered today. Given and reviewed copy of ACP documents from [employer] and encouraged to complete and return  Sun-damaged skin Referred to dermatology  Hyperlipidemia Encouraged heart healthy diet, increase exercise, avoid trans fats, consider a krill oil cap daily  Insomnia Encouraged good sleep hygiene such as dark, quiet room. No blue/green glowing lights such as computer screens in bedroom. No alcohol or stimulants in evening. Cut down on caffeine as able. Regular exercise is helpful but not just prior to bed time.   Abdominal pain Infrequent, sharp and responds to Hyoscyamine prn. Continue same, probiotics daily

## 2014-09-03 NOTE — Progress Notes (Signed)
Pre visit review using our clinic review tool, if applicable. No additional management support is needed unless otherwise documented below in the visit note. 

## 2014-09-03 NOTE — Patient Instructions (Signed)
Folic Acid 1 mg daily Apply cryotherapy and compound W bandaid to lesion    Preventive Care for Adults A healthy lifestyle and preventive care can promote health and wellness. Preventive health guidelines for women include the following key practices.  A routine yearly physical is a good way to check with your health care provider about your health and preventive screening. It is a chance to share any concerns and updates on your health and to receive a thorough exam.  Visit your dentist for a routine exam and preventive care every 6 months. Brush your teeth twice a day and floss once a day. Good oral hygiene prevents tooth decay and gum disease.  The frequency of eye exams is based on your age, health, family medical history, use of contact lenses, and other factors. Follow your health care provider's recommendations for frequency of eye exams.  Eat a healthy diet. Foods like vegetables, fruits, whole grains, low-fat dairy products, and lean protein foods contain the nutrients you need without too many calories. Decrease your intake of foods high in solid fats, added sugars, and salt. Eat the right amount of calories for you.Get information about a proper diet from your health care provider, if necessary.  Regular physical exercise is one of the most important things you can do for your health. Most adults should get at least 150 minutes of moderate-intensity exercise (any activity that increases your heart rate and causes you to sweat) each week. In addition, most adults need muscle-strengthening exercises on 2 or more days a week.  Maintain a healthy weight. The body mass index (BMI) is a screening tool to identify possible weight problems. It provides an estimate of body fat based on height and weight. Your health care provider can find your BMI and can help you achieve or maintain a healthy weight.For adults 20 years and older:  A BMI below 18.5 is considered underweight.  A BMI of 18.5  to 24.9 is normal.  A BMI of 25 to 29.9 is considered overweight.  A BMI of 30 and above is considered obese.  Maintain normal blood lipids and cholesterol levels by exercising and minimizing your intake of saturated fat. Eat a balanced diet with plenty of fruit and vegetables. Blood tests for lipids and cholesterol should begin at age 23 and be repeated every 5 years. If your lipid or cholesterol levels are high, you are over 50, or you are at high risk for heart disease, you may need your cholesterol levels checked more frequently.Ongoing high lipid and cholesterol levels should be treated with medicines if diet and exercise are not working.  If you smoke, find out from your health care provider how to quit. If you do not use tobacco, do not start.  Lung cancer screening is recommended for adults aged 39-80 years who are at high risk for developing lung cancer because of a history of smoking. A yearly low-dose CT scan of the lungs is recommended for people who have at least a 30-pack-year history of smoking and are a current smoker or have quit within the past 15 years. A pack year of smoking is smoking an average of 1 pack of cigarettes a day for 1 year (for example: 1 pack a day for 30 years or 2 packs a day for 15 years). Yearly screening should continue until the smoker has stopped smoking for at least 15 years. Yearly screening should be stopped for people who develop a health problem that would prevent them from having  lung cancer treatment.  If you are pregnant, do not drink alcohol. If you are breastfeeding, be very cautious about drinking alcohol. If you are not pregnant and choose to drink alcohol, do not have more than 1 drink per day. One drink is considered to be 12 ounces (355 mL) of beer, 5 ounces (148 mL) of wine, or 1.5 ounces (44 mL) of liquor.  Avoid use of street drugs. Do not share needles with anyone. Ask for help if you need support or instructions about stopping the use of  drugs.  High blood pressure causes heart disease and increases the risk of stroke. Your blood pressure should be checked at least every 1 to 2 years. Ongoing high blood pressure should be treated with medicines if weight loss and exercise do not work.  If you are 74-38 years old, ask your health care provider if you should take aspirin to prevent strokes.  Diabetes screening involves taking a blood sample to check your fasting blood sugar level. This should be done once every 3 years, after age 69, if you are within normal weight and without risk factors for diabetes. Testing should be considered at a younger age or be carried out more frequently if you are overweight and have at least 1 risk factor for diabetes.  Breast cancer screening is essential preventive care for women. You should practice "breast self-awareness." This means understanding the normal appearance and feel of your breasts and may include breast self-examination. Any changes detected, no matter how small, should be reported to a health care provider. Women in their 37s and 30s should have a clinical breast exam (CBE) by a health care provider as part of a regular health exam every 1 to 3 years. After age 23, women should have a CBE every year. Starting at age 56, women should consider having a mammogram (breast X-ray test) every year. Women who have a family history of breast cancer should talk to their health care provider about genetic screening. Women at a high risk of breast cancer should talk to their health care providers about having an MRI and a mammogram every year.  Breast cancer gene (BRCA)-related cancer risk assessment is recommended for women who have family members with BRCA-related cancers. BRCA-related cancers include breast, ovarian, tubal, and peritoneal cancers. Having family members with these cancers may be associated with an increased risk for harmful changes (mutations) in the breast cancer genes BRCA1 and BRCA2.  Results of the assessment will determine the need for genetic counseling and BRCA1 and BRCA2 testing.  Routine pelvic exams to screen for cancer are no longer recommended for nonpregnant women who are considered low risk for cancer of the pelvic organs (ovaries, uterus, and vagina) and who do not have symptoms. Ask your health care provider if a screening pelvic exam is right for you.  If you have had past treatment for cervical cancer or a condition that could lead to cancer, you need Pap tests and screening for cancer for at least 20 years after your treatment. If Pap tests have been discontinued, your risk factors (such as having a new sexual partner) need to be reassessed to determine if screening should be resumed. Some women have medical problems that increase the chance of getting cervical cancer. In these cases, your health care provider may recommend more frequent screening and Pap tests.  The HPV test is an additional test that may be used for cervical cancer screening. The HPV test looks for the virus that can  cause the cell changes on the cervix. The cells collected during the Pap test can be tested for HPV. The HPV test could be used to screen women aged 14 years and older, and should be used in women of any age who have unclear Pap test results. After the age of 24, women should have HPV testing at the same frequency as a Pap test.  Colorectal cancer can be detected and often prevented. Most routine colorectal cancer screening begins at the age of 86 years and continues through age 82 years. However, your health care provider may recommend screening at an earlier age if you have risk factors for colon cancer. On a yearly basis, your health care provider may provide home test kits to check for hidden blood in the stool. Use of a small camera at the end of a tube, to directly examine the colon (sigmoidoscopy or colonoscopy), can detect the earliest forms of colorectal cancer. Talk to your health  care provider about this at age 25, when routine screening begins. Direct exam of the colon should be repeated every 5-10 years through age 37 years, unless early forms of pre-cancerous polyps or small growths are found.  People who are at an increased risk for hepatitis B should be screened for this virus. You are considered at high risk for hepatitis B if:  You were born in a country where hepatitis B occurs often. Talk with your health care provider about which countries are considered high risk.  Your parents were born in a high-risk country and you have not received a shot to protect against hepatitis B (hepatitis B vaccine).  You have HIV or AIDS.  You use needles to inject street drugs.  You live with, or have sex with, someone who has hepatitis B.  You get hemodialysis treatment.  You take certain medicines for conditions like cancer, organ transplantation, and autoimmune conditions.  Hepatitis C blood testing is recommended for all people born from [birth year] through 1965 and any individual with known risks for hepatitis C.  Practice safe sex. Use condoms and avoid high-risk sexual practices to reduce the spread of sexually transmitted infections (STIs). STIs include gonorrhea, chlamydia, syphilis, trichomonas, herpes, HPV, and human immunodeficiency virus (HIV). Herpes, HIV, and HPV are viral illnesses that have no cure. They can result in disability, cancer, and death.  You should be screened for sexually transmitted illnesses (STIs) including gonorrhea and chlamydia if:  You are sexually active and are younger than 24 years.  You are older than 24 years and your health care provider tells you that you are at risk for this type of infection.  Your sexual activity has changed since you were last screened and you are at an increased risk for chlamydia or gonorrhea. Ask your health care provider if you are at risk.  If you are at risk of being infected with HIV, it is recommended  that you take a prescription medicine daily to prevent HIV infection. This is called preexposure prophylaxis (PrEP). You are considered at risk if:  You are a heterosexual woman, are sexually active, and are at increased risk for HIV infection.  You take drugs by injection.  You are sexually active with a partner who has HIV.  Talk with your health care provider about whether you are at high risk of being infected with HIV. If you choose to begin PrEP, you should first be tested for HIV. You should then be tested every 3 months for as long as you  are taking PrEP.  Osteoporosis is a disease in which the bones lose minerals and strength with aging. This can result in serious bone fractures or breaks. The risk of osteoporosis can be identified using a bone density scan. Women ages 49 years and over and women at risk for fractures or osteoporosis should discuss screening with their health care providers. Ask your health care provider whether you should take a calcium supplement or vitamin D to reduce the rate of osteoporosis.  Menopause can be associated with physical symptoms and risks. Hormone replacement therapy is available to decrease symptoms and risks. You should talk to your health care provider about whether hormone replacement therapy is right for you.  Use sunscreen. Apply sunscreen liberally and repeatedly throughout the day. You should seek shade when your shadow is shorter than you. Protect yourself by wearing long sleeves, pants, a wide-brimmed hat, and sunglasses year round, whenever you are outdoors.  Once a month, do a whole body skin exam, using a mirror to look at the skin on your back. Tell your health care provider of new moles, moles that have irregular borders, moles that are larger than a pencil eraser, or moles that have changed in shape or color.  Stay current with required vaccines (immunizations).  Influenza vaccine. All adults should be immunized every year.  Tetanus,  diphtheria, and acellular pertussis (Td, Tdap) vaccine. Pregnant women should receive 1 dose of Tdap vaccine during each pregnancy. The dose should be obtained regardless of the length of time since the last dose. Immunization is preferred during the 27th-36th week of gestation. An adult who has not previously received Tdap or who does not know her vaccine status should receive 1 dose of Tdap. This initial dose should be followed by tetanus and diphtheria toxoids (Td) booster doses every 10 years. Adults with an unknown or incomplete history of completing a 3-dose immunization series with Td-containing vaccines should begin or complete a primary immunization series including a Tdap dose. Adults should receive a Td booster every 10 years.  Varicella vaccine. An adult without evidence of immunity to varicella should receive 2 doses or a second dose if she has previously received 1 dose. Pregnant females who do not have evidence of immunity should receive the first dose after pregnancy. This first dose should be obtained before leaving the health care facility. The second dose should be obtained 4-8 weeks after the first dose.  Human papillomavirus (HPV) vaccine. Females aged 13-26 years who have not received the vaccine previously should obtain the 3-dose series. The vaccine is not recommended for use in pregnant females. However, pregnancy testing is not needed before receiving a dose. If a female is found to be pregnant after receiving a dose, no treatment is needed. In that case, the remaining doses should be delayed until after the pregnancy. Immunization is recommended for any person with an immunocompromised condition through the age of 22 years if she did not get any or all doses earlier. During the 3-dose series, the second dose should be obtained 4-8 weeks after the first dose. The third dose should be obtained 24 weeks after the first dose and 16 weeks after the second dose.  Zoster vaccine. One dose  is recommended for adults aged 74 years or older unless certain conditions are present.  Measles, mumps, and rubella (MMR) vaccine. Adults born before 65 generally are considered immune to measles and mumps. Adults born in [birth year] or later should have 1 or more doses of MMR vaccine  unless there is a contraindication to the vaccine or there is laboratory evidence of immunity to each of the three diseases. A routine second dose of MMR vaccine should be obtained at least 28 days after the first dose for students attending postsecondary schools, health care workers, or international travelers. People who received inactivated measles vaccine or an unknown type of measles vaccine during 1963-1967 should receive 2 doses of MMR vaccine. People who received inactivated mumps vaccine or an unknown type of mumps vaccine before 1979 and are at high risk for mumps infection should consider immunization with 2 doses of MMR vaccine. For females of childbearing age, rubella immunity should be determined. If there is no evidence of immunity, females who are not pregnant should be vaccinated. If there is no evidence of immunity, females who are pregnant should delay immunization until after pregnancy. Unvaccinated health care workers born before [birth year] who lack laboratory evidence of measles, mumps, or rubella immunity or laboratory confirmation of disease should consider measles and mumps immunization with 2 doses of MMR vaccine or rubella immunization with 1 dose of MMR vaccine.  Pneumococcal 13-valent conjugate (PCV13) vaccine. When indicated, a person who is uncertain of her immunization history and has no record of immunization should receive the PCV13 vaccine. An adult aged 58 years or older who has certain medical conditions and has not been previously immunized should receive 1 dose of PCV13 vaccine. This PCV13 should be followed with a dose of pneumococcal polysaccharide (PPSV23) vaccine. The PPSV23 vaccine dose should be  obtained at least 8 weeks after the dose of PCV13 vaccine. An adult aged 65 years or older who has certain medical conditions and previously received 1 or more doses of PPSV23 vaccine should receive 1 dose of PCV13. The PCV13 vaccine dose should be obtained 1 or more years after the last PPSV23 vaccine dose.  Pneumococcal polysaccharide (PPSV23) vaccine. When PCV13 is also indicated, PCV13 should be obtained first. All adults aged 57 years and older should be immunized. An adult younger than age 77 years who has certain medical conditions should be immunized. Any person who resides in a nursing home or long-term care facility should be immunized. An adult smoker should be immunized. People with an immunocompromised condition and certain other conditions should receive both PCV13 and PPSV23 vaccines. People with human immunodeficiency virus (HIV) infection should be immunized as soon as possible after diagnosis. Immunization during chemotherapy or radiation therapy should be avoided. Routine use of PPSV23 vaccine is not recommended for American Indians, [place] Natives, or people younger than 65 years unless there are medical conditions that require PPSV23 vaccine. When indicated, people who have unknown immunization and have no record of immunization should receive PPSV23 vaccine. One-time revaccination 5 years after the first dose of PPSV23 is recommended for people aged 19-64 years who have chronic kidney failure, nephrotic syndrome, asplenia, or immunocompromised conditions. People who received 1-2 doses of PPSV23 before age 40 years should receive another dose of PPSV23 vaccine at age 28 years or later if at least 5 years have passed since the previous dose. Doses of PPSV23 are not needed for people immunized with PPSV23 at or after age 16 years.  Meningococcal vaccine. Adults with asplenia or persistent complement component deficiencies should receive 2 doses of quadrivalent meningococcal conjugate  (MenACWY-D) vaccine. The doses should be obtained at least 2 months apart. Microbiologists working with certain meningococcal bacteria, [place] recruits, people at risk during an outbreak, and people who travel to or live in countries  with a high rate of meningitis should be immunized. A first-year college student up through age 39 years who is living in a residence hall should receive a dose if she did not receive a dose on or after her 16th birthday. Adults who have certain high-risk conditions should receive one or more doses of vaccine.  Hepatitis A vaccine. Adults who wish to be protected from this disease, have certain high-risk conditions, work with hepatitis A-infected animals, work in hepatitis A research labs, or travel to or work in countries with a high rate of hepatitis A should be immunized. Adults who were previously unvaccinated and who anticipate close contact with an international adoptee during the first 60 days after arrival in the [place] from a country with a high rate of hepatitis A should be immunized.  Hepatitis B vaccine. Adults who wish to be protected from this disease, have certain high-risk conditions, may be exposed to blood or other infectious body fluids, are household contacts or sex partners of hepatitis B positive people, are clients or workers in certain care facilities, or travel to or work in countries with a high rate of hepatitis B should be immunized.  Haemophilus influenzae type b (Hib) vaccine. A previously unvaccinated person with asplenia or sickle cell disease or having a scheduled splenectomy should receive 1 dose of Hib vaccine. Regardless of previous immunization, a recipient of a hematopoietic stem cell transplant should receive a 3-dose series 6-12 months after her successful transplant. Hib vaccine is not recommended for adults with HIV infection. Preventive Services / Frequency Ages 70 to 35 years  Blood pressure check.** / Every 1 to 2  years.  Lipid and cholesterol check.** / Every 5 years beginning at age 19.  Clinical breast exam.** / Every 3 years for women in their 40s and 74s.  BRCA-related cancer risk assessment.** / For women who have family members with a BRCA-related cancer (breast, ovarian, tubal, or peritoneal cancers).  Pap test.** / Every 2 years from ages 39 through 62. Every 3 years starting at age 25 through age 55 or 34 with a history of 3 consecutive normal Pap tests.  HPV screening.** / Every 3 years from ages 61 through ages 69 to 32 with a history of 3 consecutive normal Pap tests.  Hepatitis C blood test.** / For any individual with known risks for hepatitis C.  Skin self-exam. / Monthly.  Influenza vaccine. / Every year.  Tetanus, diphtheria, and acellular pertussis (Tdap, Td) vaccine.** / Consult your health care provider. Pregnant women should receive 1 dose of Tdap vaccine during each pregnancy. 1 dose of Td every 10 years.  Varicella vaccine.** / Consult your health care provider. Pregnant females who do not have evidence of immunity should receive the first dose after pregnancy.  HPV vaccine. / 3 doses over 6 months, if 42 and younger. The vaccine is not recommended for use in pregnant females. However, pregnancy testing is not needed before receiving a dose.  Measles, mumps, rubella (MMR) vaccine.** / You need at least 1 dose of MMR if you were born in 1957 or later. You may also need a 2nd dose. For females of childbearing age, rubella immunity should be determined. If there is no evidence of immunity, females who are not pregnant should be vaccinated. If there is no evidence of immunity, females who are pregnant should delay immunization until after pregnancy.  Pneumococcal 13-valent conjugate (PCV13) vaccine.** / Consult your health care provider.  Pneumococcal polysaccharide (PPSV23) vaccine.** / 1  to 2 doses if you smoke cigarettes or if you have certain conditions.  Meningococcal  vaccine.** / 1 dose if you are age 62 to 94 years and a Market researcher living in a residence hall, or have one of several medical conditions, you need to get vaccinated against meningococcal disease. You may also need additional booster doses.  Hepatitis A vaccine.** / Consult your health care provider.  Hepatitis B vaccine.** / Consult your health care provider.  Haemophilus influenzae type b (Hib) vaccine.** / Consult your health care provider. Ages 36 to 83 years  Blood pressure check.** / Every 1 to 2 years.  Lipid and cholesterol check.** / Every 5 years beginning at age 24 years.  Lung cancer screening. / Every year if you are aged 45-80 years and have a 30-pack-year history of smoking and currently smoke or have quit within the past 15 years. Yearly screening is stopped once you have quit smoking for at least 15 years or develop a health problem that would prevent you from having lung cancer treatment.  Clinical breast exam.** / Every year after age 67 years.  BRCA-related cancer risk assessment.** / For women who have family members with a BRCA-related cancer (breast, ovarian, tubal, or peritoneal cancers).  Mammogram.** / Every year beginning at age 5 years and continuing for as long as you are in good health. Consult with your health care provider.  Pap test.** / Every 3 years starting at age 10 years through age 68 or 64 years with a history of 3 consecutive normal Pap tests.  HPV screening.** / Every 3 years from ages 40 years through ages 63 to 54 years with a history of 3 consecutive normal Pap tests.  Fecal occult blood test (FOBT) of stool. / Every year beginning at age 75 years and continuing until age 26 years. You may not need to do this test if you get a colonoscopy every 10 years.  Flexible sigmoidoscopy or colonoscopy.** / Every 5 years for a flexible sigmoidoscopy or every 10 years for a colonoscopy beginning at age 43 years and continuing until age 31  years.  Hepatitis C blood test.** / For all people born from [birth year] through 1965 and any individual with known risks for hepatitis C.  Skin self-exam. / Monthly.  Influenza vaccine. / Every year.  Tetanus, diphtheria, and acellular pertussis (Tdap/Td) vaccine.** / Consult your health care provider. Pregnant women should receive 1 dose of Tdap vaccine during each pregnancy. 1 dose of Td every 10 years.  Varicella vaccine.** / Consult your health care provider. Pregnant females who do not have evidence of immunity should receive the first dose after pregnancy.  Zoster vaccine.** / 1 dose for adults aged 52 years or older.  Measles, mumps, rubella (MMR) vaccine.** / You need at least 1 dose of MMR if you were born in 1957 or later. You may also need a 2nd dose. For females of childbearing age, rubella immunity should be determined. If there is no evidence of immunity, females who are not pregnant should be vaccinated. If there is no evidence of immunity, females who are pregnant should delay immunization until after pregnancy.  Pneumococcal 13-valent conjugate (PCV13) vaccine.** / Consult your health care provider.  Pneumococcal polysaccharide (PPSV23) vaccine.** / 1 to 2 doses if you smoke cigarettes or if you have certain conditions.  Meningococcal vaccine.** / Consult your health care provider.  Hepatitis A vaccine.** / Consult your health care provider.  Hepatitis B vaccine.** / Consult your health  care provider.  Haemophilus influenzae type b (Hib) vaccine.** / Consult your health care provider. Ages 50 years and over  Blood pressure check.** / Every 1 to 2 years.  Lipid and cholesterol check.** / Every 5 years beginning at age 38 years.  Lung cancer screening. / Every year if you are aged 4-80 years and have a 30-pack-year history of smoking and currently smoke or have quit within the past 15 years. Yearly screening is stopped once you have quit smoking for at least 15 years or  develop a health problem that would prevent you from having lung cancer treatment.  Clinical breast exam.** / Every year after age 47 years.  BRCA-related cancer risk assessment.** / For women who have family members with a BRCA-related cancer (breast, ovarian, tubal, or peritoneal cancers).  Mammogram.** / Every year beginning at age 78 years and continuing for as long as you are in good health. Consult with your health care provider.  Pap test.** / Every 3 years starting at age 78 years through age 31 or 87 years with 3 consecutive normal Pap tests. Testing can be stopped between 65 and 70 years with 3 consecutive normal Pap tests and no abnormal Pap or HPV tests in the past 10 years.  HPV screening.** / Every 3 years from ages 65 years through ages 5 or 78 years with a history of 3 consecutive normal Pap tests. Testing can be stopped between 65 and 70 years with 3 consecutive normal Pap tests and no abnormal Pap or HPV tests in the past 10 years.  Fecal occult blood test (FOBT) of stool. / Every year beginning at age 80 years and continuing until age 18 years. You may not need to do this test if you get a colonoscopy every 10 years.  Flexible sigmoidoscopy or colonoscopy.** / Every 5 years for a flexible sigmoidoscopy or every 10 years for a colonoscopy beginning at age 38 years and continuing until age 51 years.  Hepatitis C blood test.** / For all people born from [birth year] through 1964 and any individual with known risks for hepatitis C.  Osteoporosis screening.** / A one-time screening for women ages 57 years and over and women at risk for fractures or osteoporosis.  Skin self-exam. / Monthly.  Influenza vaccine. / Every year.  Tetanus, diphtheria, and acellular pertussis (Tdap/Td) vaccine.** / 1 dose of Td every 10 years.  Varicella vaccine.** / Consult your health care provider.  Zoster vaccine.** / 1 dose for adults aged 65 years or older.  Pneumococcal 13-valent conjugate  (PCV13) vaccine.** / Consult your health care provider.  Pneumococcal polysaccharide (PPSV23) vaccine.** / 1 dose for all adults aged 14 years and older.  Meningococcal vaccine.** / Consult your health care provider.  Hepatitis A vaccine.** / Consult your health care provider.  Hepatitis B vaccine.** / Consult your health care provider.  Haemophilus influenzae type b (Hib) vaccine.** / Consult your health care provider. ** Family history and personal history of risk and conditions may change your health care provider's recommendations. Document Released: 03/16/2001 Document Revised: 06/04/2013 Document Reviewed: 06/15/2010 [facility] Patient Information 2015 [place], [place]. This information is not intended to replace advice given to you by your health care provider. Make sure you discuss any questions you have with your health care provider.

## 2014-09-03 NOTE — Assessment & Plan Note (Signed)
Patient encouraged to maintain heart healthy diet, regular exercise, adequate sleep. Consider daily probiotics. Take medications as prescribed. Labs ordered today. Given and reviewed copy of ACP documents from [employer] and encouraged to complete and return

## 2014-09-04 LAB — CERVICOVAGINAL ANCILLARY ONLY
Bacterial vaginitis: NEGATIVE
Candida vaginitis: NEGATIVE

## 2014-09-04 LAB — CYTOLOGY - PAP

## 2014-09-15 ENCOUNTER — Encounter: Payer: Self-pay | Admitting: Family Medicine

## 2014-09-15 DIAGNOSIS — L578 Other skin changes due to chronic exposure to nonionizing radiation: Secondary | ICD-10-CM | POA: Insufficient documentation

## 2014-09-15 DIAGNOSIS — R109 Unspecified abdominal pain: Secondary | ICD-10-CM

## 2014-09-15 HISTORY — DX: Other skin changes due to chronic exposure to nonionizing radiation: L57.8

## 2014-09-15 HISTORY — DX: Unspecified abdominal pain: R10.9

## 2014-09-15 NOTE — Assessment & Plan Note (Signed)
Infrequent, sharp and responds to Hyoscyamine prn. Continue same, probiotics daily

## 2014-09-15 NOTE — Assessment & Plan Note (Signed)
Encouraged good sleep hygiene such as dark, quiet room. No blue/green glowing lights such as computer screens in bedroom. No alcohol or stimulants in evening. Cut down on caffeine as able. Regular exercise is helpful but not just prior to bed time.  

## 2014-09-15 NOTE — Assessment & Plan Note (Signed)
Referred to dermatology 

## 2014-09-15 NOTE — Assessment & Plan Note (Signed)
Encouraged heart healthy diet, increase exercise, avoid trans fats, consider a krill oil cap daily 

## 2014-09-21 ENCOUNTER — Encounter: Payer: Self-pay | Admitting: Family Medicine

## 2014-12-25 ENCOUNTER — Encounter: Payer: Self-pay | Admitting: Family Medicine

## 2014-12-25 ENCOUNTER — Ambulatory Visit (INDEPENDENT_AMBULATORY_CARE_PROVIDER_SITE_OTHER): Payer: Commercial Managed Care - HMO | Admitting: Family Medicine

## 2014-12-25 VITALS — BP 99/67 | HR 80 | Temp 99.7°F | Resp 20 | Wt 136.0 lb

## 2014-12-25 DIAGNOSIS — J029 Acute pharyngitis, unspecified: Secondary | ICD-10-CM | POA: Insufficient documentation

## 2014-12-25 DIAGNOSIS — R0981 Nasal congestion: Secondary | ICD-10-CM | POA: Insufficient documentation

## 2014-12-25 LAB — POCT RAPID STREP A (OFFICE): RAPID STREP A SCREEN: NEGATIVE

## 2014-12-25 MED ORDER — FLUTICASONE PROPIONATE 50 MCG/ACT NA SUSP: 2.0000 | Freq: Every day | NASAL | Status: DC

## 2014-12-25 MED ORDER — CEFDINIR 300 MG PO CAPS: 600.0000 mg | ORAL_CAPSULE | Freq: Every day | ORAL | Status: DC

## 2014-12-25 NOTE — Progress Notes (Signed)
   Subjective:    Patient ID: Cynthia Blanchard, female    DOB: December 25, 1975, 39 y.o.   MRN: [id number]  HPI  Sore throat: Pt presents for an acute office visit with an 11 day history of sore throat, rhinorrhea, congestion. She states approximately for 5 days into her illness she thought she was getting better, then her symptoms have returned over the weekend even worse. She states she has a severe sore throat, white spots on her tonsils, congestion, chills, headache and decreased appetite. She is tolerating food/drink, without nausea, vomiting or diarrhea. She endorses mild cough, that she states is mostly from the drainage down the back of her throat. Patient has taken Chloraseptic, which she states is not working very well. She also has been taking ibuprofen for the headache and throat pain. Her last ibuprofen dose was 2 hours ago, 800 mg of Advil. Patient does have low-grade temperature despite Advil use. She states she gets a couple strep infections a year. Birth of her children are also ill.  Never smoker  Past Medical History  Diagnosis Date  . Chicken pox as a child  . Asthma     as a child  . Diabetes mellitus without complication ([place]) 4401    gestational diabetes- first pregnancy- not in the last 2 pregnancies  . Insomnia 12/09/2013  . Spondylolisthesis 12/09/2013  . Dysuria 12/09/2013  . Hyperlipidemia 12/09/2013  . Throat irritation 12/09/2013  . Cervical cancer screening 09/03/2014  . Preventative health care 09/03/2014  . Sun-damaged skin 09/15/2014  . Abdominal pain 09/15/2014   Allergies  Allergen Reactions  . Latex Hives  . Penicillins Hives    Review of Systems Negative, with the exception of above mentioned in HPI     Objective:   Physical Exam BP 99/67 mmHg  Pulse 80  Temp(Src) 99.7 F (37.6 C) (Oral)  Resp 20  Wt 136 lb (61.689 kg)  SpO2 99%  LMP 12/16/2014 Gen: febrile. No acute distress. Appears fatigued. Thin caucasian female. Pleasant.  HENT: AT. [place]. Bilateral  TM visualized, full/shiny, no erythema or bulging. MMM. No oral lesions. Bilateral nares with erythema and swelling. Throat with erythema and bilateral tonsillar exudates. Mild cough exam, hoarseness on exam. No TTP facial sinus.  Eyes: watery eyes. Pupils Equal Round Reactive to light, Extraocular movements intact,  Conjunctiva without redness, discharge or icterus. Neck/lymp/endocrine: Supple,bilateral enlarged cervical lymphadenopthy  CV: RRR  Chest: CTAB, no wheeze or crackles Abd: Soft.flat. NTND. BS present. No Masses palpated.  Skin: No rashes, purpura or petechiae.     Assessment & Plan:  Exudative pharyngitis/nasal congestion - PCN allergic as a child, with mild hives.  - exam consistent with strep, although rapid strep negative. Will treat given fever (despite 800 mg advil), and large lymphadenopathy, as well as duration of illness.  - cefdinir (OMNICEF) 300 MG capsule; Take 2 capsules (600 mg total) by mouth daily.  Dispense: 20 capsule; Refill: 0 - flonase for nasal congestion, discouraged continued afrin use.  - Mucinex to thin out secretions.  - rest, hydrate. Advil for fever/aches.  - F/U 1 week with PCP if no improvement.

## 2014-12-25 NOTE — Patient Instructions (Addendum)
Pharyngitis Pharyngitis is redness, pain, and swelling (inflammation) of your pharynx.  CAUSES  Pharyngitis is usually caused by infection. Most of the time, these infections are from viruses (viral) and are part of a cold. However, sometimes pharyngitis is caused by bacteria (bacterial). Pharyngitis can also be caused by allergies. Viral pharyngitis may be spread from person to person by coughing, sneezing, and personal items or utensils (cups, forks, spoons, toothbrushes). Bacterial pharyngitis may be spread from person to person by more intimate contact, such as kissing.  SIGNS AND SYMPTOMS  Symptoms of pharyngitis include:   Sore throat.   Tiredness (fatigue).   Low-grade fever.   Headache.  Joint pain and muscle aches.  Skin rashes.  Swollen lymph nodes.  Plaque-like film on throat or tonsils (often seen with bacterial pharyngitis). DIAGNOSIS  Your health care provider will ask you questions about your illness and your symptoms. Your medical history, along with a physical exam, is often all that is needed to diagnose pharyngitis. Sometimes, a rapid strep test is done. Other lab tests may also be done, depending on the suspected cause.  TREATMENT  Viral pharyngitis will usually get better in 3-4 days without the use of medicine. Bacterial pharyngitis is treated with medicines that kill germs (antibiotics).  HOME CARE INSTRUCTIONS   Drink enough water and fluids to keep your urine clear or pale yellow.   Only take over-the-counter or prescription medicines as directed by your health care provider:   If you are prescribed antibiotics, make sure you finish them even if you start to feel better.   Do not take aspirin.   Get lots of rest.   Gargle with 8 oz of salt water ( tsp of salt per 1 qt of water) as often as every 1-2 hours to soothe your throat.   Throat lozenges (if you are not at risk for choking) or sprays may be used to soothe your throat. SEEK MEDICAL  CARE IF:   You have large, tender lumps in your neck.  You have a rash.  You cough up green, yellow-brown, or bloody spit. SEEK IMMEDIATE MEDICAL CARE IF:   Your neck becomes stiff.  You drool or are unable to swallow liquids.  You vomit or are unable to keep medicines or liquids down.  You have severe pain that does not go away with the use of recommended medicines.  You have trouble breathing (not caused by a stuffy nose). MAKE SURE YOU:   Understand these instructions.  Will watch your condition.  Will get help right away if you are not doing well or get worse.   This information is not intended to replace advice given to you by your health care provider. Make sure you discuss any questions you have with your health care provider.   Document Released: 01/18/2005 Document Revised: 11/08/2012 Document Reviewed: 09/25/2012 Elsevier Interactive Patient Education [employer].  I have called in cefdinir for you to take for 10 days. I have also called in flonase to help with the nasal swelling/congestion.  Rest, stay hydrated, use advil for fever/aches. Try mucinex for decreasing secretions.

## 2015-10-02 ENCOUNTER — Encounter: Payer: Commercial Managed Care - HMO | Admitting: Family Medicine

## 2015-11-11 ENCOUNTER — Ambulatory Visit (HOSPITAL_BASED_OUTPATIENT_CLINIC_OR_DEPARTMENT_OTHER)
Admission: RE | Admit: 2015-11-11 | Discharge: 2015-11-11 | Disposition: A | Discharge status: Home / Self Care | Payer: 59 | Source: Ambulatory Visit | Attending: Family Medicine | Admitting: Family Medicine

## 2015-11-11 ENCOUNTER — Encounter: Payer: Self-pay | Admitting: Family Medicine

## 2015-11-11 ENCOUNTER — Ambulatory Visit (INDEPENDENT_AMBULATORY_CARE_PROVIDER_SITE_OTHER): Payer: 59 | Admitting: Family Medicine

## 2015-11-11 VITALS — BP 120/70 | HR 70 | Temp 98.0°F | Ht 67.0 in | Wt 137.5 lb

## 2015-11-11 DIAGNOSIS — M546 Pain in thoracic spine: Secondary | ICD-10-CM

## 2015-11-11 MED ORDER — CYCLOBENZAPRINE HCL 10 MG PO TABS: 10.0000 mg | ORAL_TABLET | Freq: Three times a day (TID) | ORAL | 0 refills | Status: DC | PRN

## 2015-11-11 NOTE — Patient Instructions (Signed)

## 2015-11-11 NOTE — Progress Notes (Signed)
Pre visit review using our clinic review tool, if applicable. No additional management support is needed unless otherwise documented below in the visit note. 

## 2015-11-19 DIAGNOSIS — M549 Dorsalgia, unspecified: Secondary | ICD-10-CM | POA: Insufficient documentation

## 2015-11-19 NOTE — Progress Notes (Signed)
Patient ID: Cynthia Blanchard, female   DOB: 19-Mar-1975, 40 y.o.   MRN: [id number]   Subjective:    Patient ID: Cynthia Blanchard, female    DOB: 11-Dec-1975, 40 y.o.   MRN: [id number]  Chief Complaint  Patient presents with  . Back Pain    HPI Patient is in today for evaluation of 24 hours worth of thoracic back pain. She was holding her daugter last night and fell while twisting at the same time and has been in pain since. Is slightly better this am. She has been under a great deal of stress since her husband sustained a TBI in a motorcycle accident over the summer. Denies CP/palp/SOB/HA/congestion/fevers/GI or GU c/o. Taking meds as prescribed  Past Medical History:  Diagnosis Date  . Abdominal pain 09/15/2014  . Asthma    as a child  . Cervical cancer screening 09/03/2014  . Chicken pox as a child  . Diabetes mellitus without complication ([place]) 8325   gestational diabetes- first pregnancy- not in the last 2 pregnancies  . Dysuria 12/09/2013  . Hyperlipidemia 12/09/2013  . Insomnia 12/09/2013  . Preventative health care 09/03/2014  . Spondylolisthesis 12/09/2013  . Sun-damaged skin 09/15/2014  . Throat irritation 12/09/2013    Past Surgical History:  Procedure Laterality Date  . DILATION AND CURETTAGE OF UTERUS  2012  . FOOT SURGERY  8-10   right- cyst- size of a golf ball  . tube removed  2012   right tube  . tube ressected  2009   right tube    Family History  Problem Relation Age of Onset  . Hypertension Mother   . Obesity Father   . Depression Father   . Cancer Maternal Grandmother     skin  . Other Maternal Grandmother     valve replacement  . Cancer Maternal Grandfather     colon  . Diabetes Paternal Grandmother     type 2  . Stroke Paternal Grandmother   . Cancer Paternal Grandfather     prostate  . Mental illness Brother   . HIV Brother   . Drug abuse Brother     Social History   Social History  . Marital status: Married    Spouse name: N/A  . Number of  children: N/A  . Years of education: N/A   Occupational History  . Not on file.   Social History Main Topics  . Smoking status: Never Smoker  . Smokeless tobacco: Never Used  . Alcohol use 0.0 oz/week     Comment: glass of wine every night  . Drug use: No  . Sexual activity: Yes    Partners: Male     Comment: live with husband and 3 children. 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 Not on file   Social History Narrative  . No narrative on file    Outpatient Medications Prior to Visit  Medication Sig Dispense Refill  . ALPRAZolam (XANAX) 0.25 MG tablet Take 1 tablet (0.25 mg total) by mouth 2 (two) times daily as needed for anxiety. 20 tablet 2  . Alum & Mag Hydroxide-Simeth (MAGIC MOUTHWASH) SOLN Take 5 mLs by mouth 4 (four) times daily as needed for mouth pain. 100 mL 0  . FLUOROPLEX 1 % cream     . fluticasone (FLONASE) 50 MCG/ACT nasal spray Place 2 sprays into both nostrils daily. 16 g 6  . hyoscyamine (LEVSIN SL) 0.125 MG SL tablet Place 1 tablet (0.125  mg total) under the tongue every 6 (six) hours as needed. 30 tablet 1  . Probiotic Product (PROBIOTIC DAILY PO) Take by mouth.    . cefdinir (OMNICEF) 300 MG capsule Take 2 capsules (600 mg total) by mouth daily. 20 capsule 0   No facility-administered medications prior to visit.     Allergies  Allergen Reactions  . Latex Hives  . Penicillins Hives    Review of Systems  Constitutional: Negative for fever and malaise/fatigue.  HENT: Negative for congestion.   Eyes: Negative for blurred vision.  Respiratory: Negative for shortness of breath.   Cardiovascular: Negative for chest pain, palpitations and leg swelling.  Gastrointestinal: Negative for abdominal pain, blood in stool and nausea.  Genitourinary: Negative for dysuria and frequency.  Musculoskeletal: Positive for back pain and joint pain. Negative for falls.  Skin: Negative for rash.  Neurological: Negative for  dizziness, loss of consciousness and headaches.  Endo/Heme/Allergies: Negative for environmental allergies.  Psychiatric/Behavioral: Negative for depression. The patient is not nervous/anxious.        Objective:    Physical Exam  Constitutional: She is oriented to person, place, and time. She appears well-developed and well-nourished. No distress.  HENT:  Head: Normocephalic and atraumatic.  Nose: Nose normal.  Eyes: Right eye exhibits no discharge. Left eye exhibits no discharge.  Neck: Normal range of motion. Neck supple.  Cardiovascular: Normal rate and regular rhythm.   No murmur heard. Pulmonary/Chest: Effort normal and breath sounds normal.  Abdominal: Soft. Bowel sounds are normal. There is no tenderness.  Musculoskeletal: She exhibits no edema.  Neurological: She is alert and oriented to person, place, and time.  Skin: Skin is warm and dry.  Psychiatric: She has a normal mood and affect.  Nursing note and vitals reviewed.   BP 120/70 (BP Location: Left Arm, Patient Position: Sitting, Cuff Size: Normal)   Pulse 70   Temp 98 F (36.7 C) (Oral)   Ht 5' 7"  (1.702 m)   Wt 137 lb 8 oz (62.4 kg)   SpO2 98%   BMI 21.54 kg/m  Wt Readings from Last 3 Encounters:  11/11/15 137 lb 8 oz (62.4 kg)  12/25/14 136 lb (61.7 kg)  09/03/14 131 lb 8 oz (59.6 kg)     Lab Results  Component Value Date   WBC 7.7 09/03/2014   HGB 12.4 09/03/2014   HCT 38.0 09/03/2014   PLT 335.0 09/03/2014   GLUCOSE 78 09/03/2014   CHOL 158 09/03/2014   TRIG 89.0 09/03/2014   HDL 59.00 09/03/2014   LDLCALC 81 09/03/2014   ALT 16 09/03/2014   AST 20 09/03/2014   NA 140 09/03/2014   K 3.6 09/03/2014   CL 103 09/03/2014   CREATININE 0.60 09/03/2014   BUN 13 09/03/2014   CO2 32 09/03/2014   TSH 0.93 09/03/2014    Lab Results  Component Value Date   TSH 0.93 09/03/2014   Lab Results  Component Value Date   WBC 7.7 09/03/2014   HGB 12.4 09/03/2014   HCT 38.0 09/03/2014   MCV 89.1  09/03/2014   PLT 335.0 09/03/2014   Lab Results  Component Value Date   NA 140 09/03/2014   K 3.6 09/03/2014   CO2 32 09/03/2014   GLUCOSE 78 09/03/2014   BUN 13 09/03/2014   CREATININE 0.60 09/03/2014   BILITOT 0.5 09/03/2014   ALKPHOS 58 09/03/2014   AST 20 09/03/2014   ALT 16 09/03/2014   PROT 7.0 09/03/2014   ALBUMIN 4.3  09/03/2014   CALCIUM 8.9 09/03/2014   GFR 118.59 09/03/2014   Lab Results  Component Value Date   CHOL 158 09/03/2014   Lab Results  Component Value Date   HDL 59.00 09/03/2014   Lab Results  Component Value Date   LDLCALC 81 09/03/2014   Lab Results  Component Value Date   TRIG 89.0 09/03/2014   Lab Results  Component Value Date   CHOLHDL 3 09/03/2014   No results found for: HGBA1C     Assessment & Plan:   Problem List Items Addressed This Visit    Acute midline thoracic back pain - Primary    Encouraged moist heat and gentle stretching as tolerated. May try NSAIDs and prescription meds as directed and report if symptoms worsen or seek immediate care. Xray, muscle relaxers and steroids. Report if no improvement      Relevant Medications   cyclobenzaprine (FLEXERIL) 10 MG tablet   Other Relevant Orders   DG Thoracic Spine 2 View (Completed)    Other Visit Diagnoses   None.     I have discontinued Ms. Formosa's cefdinir. I am also having her start on cyclobenzaprine. Additionally, I am having her maintain her Probiotic Product (PROBIOTIC DAILY PO), ALPRAZolam, hyoscyamine, magic mouthwash, FLUOROPLEX, and fluticasone.  Meds ordered this encounter  Medications  . cyclobenzaprine (FLEXERIL) 10 MG tablet    Sig: Take 1 tablet (10 mg total) by mouth 3 (three) times daily as needed for muscle spasms.    Dispense:  30 tablet    Refill:  0     Penni Homans, MD

## 2015-11-19 NOTE — Assessment & Plan Note (Signed)
Encouraged moist heat and gentle stretching as tolerated. May try NSAIDs and prescription meds as directed and report if symptoms worsen or seek immediate care. Xray, muscle relaxers and steroids. Report if no improvement

## 2015-11-28 ENCOUNTER — Encounter: Payer: Self-pay | Admitting: Family Medicine

## 2015-11-28 ENCOUNTER — Ambulatory Visit (INDEPENDENT_AMBULATORY_CARE_PROVIDER_SITE_OTHER): Payer: 59 | Admitting: Family Medicine

## 2015-11-28 VITALS — BP 133/69 | HR 62 | Temp 97.7°F | Wt 138.4 lb

## 2015-11-28 DIAGNOSIS — E784 Other hyperlipidemia: Secondary | ICD-10-CM

## 2015-11-28 DIAGNOSIS — R5383 Other fatigue: Secondary | ICD-10-CM | POA: Insufficient documentation

## 2015-11-28 DIAGNOSIS — Z0000 Encounter for general adult medical examination without abnormal findings: Secondary | ICD-10-CM

## 2015-11-28 DIAGNOSIS — R109 Unspecified abdominal pain: Secondary | ICD-10-CM | POA: Diagnosis not present

## 2015-11-28 DIAGNOSIS — F4321 Adjustment disorder with depressed mood: Secondary | ICD-10-CM

## 2015-11-28 DIAGNOSIS — G4700 Insomnia, unspecified: Secondary | ICD-10-CM

## 2015-11-28 DIAGNOSIS — F4320 Adjustment disorder, unspecified: Secondary | ICD-10-CM

## 2015-11-28 DIAGNOSIS — E7849 Other hyperlipidemia: Secondary | ICD-10-CM

## 2015-11-28 HISTORY — DX: Adjustment disorder, unspecified: F43.20

## 2015-11-28 HISTORY — DX: Adjustment disorder with depressed mood: F43.21

## 2015-11-28 HISTORY — DX: Other fatigue: R53.83

## 2015-11-28 LAB — CBC
HEMATOCRIT: 40.5 % (ref 36.0–46.0)
Hemoglobin: 13.6 g/dL (ref 12.0–15.0)
MCHC: 33.4 g/dL (ref 30.0–36.0)
MCV: 91.9 fl (ref 78.0–100.0)
Platelets: 276 10*3/uL (ref 150.0–400.0)
RBC: 4.41 Mil/uL (ref 3.87–5.11)
RDW: 14.1 % (ref 11.5–15.5)
WBC: 7.7 10*3/uL (ref 4.0–10.5)

## 2015-11-28 LAB — COMPREHENSIVE METABOLIC PANEL
ALBUMIN: 4.6 g/dL (ref 3.5–5.2)
ALK PHOS: 51 U/L (ref 39–117)
ALT: 16 U/L (ref 0–35)
AST: 21 U/L (ref 0–37)
BUN: 16 mg/dL (ref 6–23)
CHLORIDE: 105 meq/L (ref 96–112)
CO2: 28 mEq/L (ref 19–32)
Calcium: 9.6 mg/dL (ref 8.4–10.5)
Creatinine, Ser: 0.61 mg/dL (ref 0.40–1.20)
GFR: 115.61 mL/min (ref >60.00)
GLUCOSE: 86 mg/dL (ref 70–99)
POTASSIUM: 3.7 meq/L (ref 3.5–5.1)
Sodium: 138 mEq/L (ref 135–145)
TOTAL PROTEIN: 7.4 g/dL (ref 6.0–8.3)
Total Bilirubin: 0.7 mg/dL (ref 0.2–1.2)

## 2015-11-28 LAB — LIPID PANEL
CHOLESTEROL: 152 mg/dL (ref 0–200)
HDL: 80.3 mg/dL (ref >39.00)
LDL CALC: 63 mg/dL (ref 0–99)
NonHDL: 71.9
Total CHOL/HDL Ratio: 2
Triglycerides: 45 mg/dL (ref 0.0–149.0)
VLDL: 9 mg/dL (ref 0.0–40.0)

## 2015-11-28 MED ORDER — ALPRAZOLAM 0.5 MG PO TABS: 0.5000 mg | ORAL_TABLET | Freq: Two times a day (BID) | ORAL | 2 refills | Status: DC

## 2015-11-28 MED ORDER — ALPRAZOLAM 0.5 MG PO TBDP: 0.5000 mg | ORAL_TABLET | Freq: Two times a day (BID) | ORAL | 2 refills | Status: DC

## 2015-11-28 MED ORDER — ALPRAZOLAM ER 0.5 MG PO TB24: 0.5000 mg | ORAL_TABLET | Freq: Two times a day (BID) | ORAL | 2 refills | Status: DC

## 2015-11-28 NOTE — Assessment & Plan Note (Signed)
Encouraged heart healthy diet, increase exercise, avoid trans fats, consider a krill oil cap daily 

## 2015-11-28 NOTE — Assessment & Plan Note (Signed)
Given refill on Alprazolam prn

## 2015-11-28 NOTE — Assessment & Plan Note (Addendum)
Patient encouraged to maintain heart healthy diet, regular exercise, adequate sleep. Consider daily probiotics. Take medications as prescribed. Given and reviewed copy of ACP documents from [place] Secretary of State and encouraged to complete and return 

## 2015-11-28 NOTE — Progress Notes (Signed)
.Patient ID: Cynthia Blanchard, female   DOB: Mar 01, 1975, 40 y.o.   MRN: [id number]   Subjective:    Patient ID: Cynthia Blanchard, female    DOB: 05-06-1975, 40 y.o.   MRN: [id number]  Chief Complaint  Patient presents with  . Annual Exam    HPI Patient is in today for an annual exam with back pain, and fatigue. She is struggling with her husbands poor health and managing work and their young children. Sleep is difficult to get sometimes. No suicidal ideation but does note some anhedonia. Denies CP/palp/SOB/HA/congestion/fevers/GI or GU c/o. Taking meds as prescribed  Past Medical History:  Diagnosis Date  . Abdominal pain 09/15/2014  . Asthma    as a child  . Cervical cancer screening 09/03/2014  . Chicken pox as a child  . Diabetes mellitus without complication ([place]) 2595   gestational diabetes- first pregnancy- not in the last 2 pregnancies  . Dysuria 12/09/2013  . Fatigue 11/28/2015  . Grief reaction 11/28/2015  . Hyperlipidemia 12/09/2013  . Insomnia 12/09/2013  . Preventative health care 09/03/2014  . Spondylolisthesis 12/09/2013  . Sun-damaged skin 09/15/2014  . Throat irritation 12/09/2013    Past Surgical History:  Procedure Laterality Date  . DILATION AND CURETTAGE OF UTERUS  2012  . FOOT SURGERY  8-10   right- cyst- size of a golf ball  . tube removed  2012   right tube  . tube ressected  2009   right tube    Family History  Problem Relation Age of Onset  . Hypertension Mother   . Obesity Father   . Depression Father   . Cancer Maternal Grandmother     skin  . Other Maternal Grandmother     valve replacement  . Cancer Maternal Grandfather     colon  . Diabetes Paternal Grandmother     type 2  . Stroke Paternal Grandmother   . Cancer Paternal Grandfather     prostate  . Mental illness Brother   . HIV Brother   . Drug abuse Brother   . Other Brother     Glomerular Nephritis    Social History   Social History  . Marital status: Married    Spouse name: N/A    . Number of children: N/A  . Years of education: N/A   Occupational History  . Not on file.   Social History Main Topics  . Smoking status: Never Smoker  . Smokeless tobacco: Never Used  . Alcohol use 0.0 oz/week     Comment: glass of wine every night  . Drug use: No  . Sexual activity: Yes    Partners: Male     Comment: live with husband and 3 children. 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 Not on file   Social History Narrative  . No narrative on file    Outpatient Medications Prior to Visit  Medication Sig Dispense Refill  . Alum & Mag Hydroxide-Simeth (MAGIC MOUTHWASH) SOLN Take 5 mLs by mouth 4 (four) times daily as needed for mouth pain. 100 mL 0  . cyclobenzaprine (FLEXERIL) 10 MG tablet Take 1 tablet (10 mg total) by mouth 3 (three) times daily as needed for muscle spasms. 30 tablet 0  . FLUOROPLEX 1 % cream     . fluticasone (FLONASE) 50 MCG/ACT nasal spray Place 2 sprays into both nostrils daily. 16 g 6  . hyoscyamine (LEVSIN SL) 0.125 MG SL tablet Place 1 tablet (  0.125 mg total) under the tongue every 6 (six) hours as needed. 30 tablet 1  . Probiotic Product (PROBIOTIC DAILY PO) Take by mouth.    . ALPRAZolam (XANAX) 0.25 MG tablet Take 1 tablet (0.25 mg total) by mouth 2 (two) times daily as needed for anxiety. 20 tablet 2   No facility-administered medications prior to visit.     Allergies  Allergen Reactions  . Latex Hives  . Penicillins Hives    Review of Systems  Constitutional: Positive for malaise/fatigue. Negative for fever.  Eyes: Negative for blurred vision.  Respiratory: Negative for cough and shortness of breath.   Cardiovascular: Negative for chest pain and palpitations.  Gastrointestinal: Negative for vomiting.  Musculoskeletal: Positive for back pain.  Skin: Negative for rash.  Neurological: Negative for loss of consciousness and headaches.       Objective:    Physical Exam   Constitutional: She is oriented to person, place, and time. She appears well-developed and well-nourished. No distress.  HENT:  Head: Normocephalic and atraumatic.  Eyes: Conjunctivae are normal.  Neck: Normal range of motion. No thyromegaly present.  Cardiovascular: Normal rate and regular rhythm.   Pulmonary/Chest: Effort normal and breath sounds normal. She has no wheezes.  Abdominal: Soft. Bowel sounds are normal. There is no tenderness.  Musculoskeletal: Normal range of motion. She exhibits no edema or deformity.  Neurological: She is alert and oriented to person, place, and time.  Skin: Skin is warm and dry. She is not diaphoretic.  Psychiatric: She has a normal mood and affect.    BP 133/69 (BP Location: Left Arm, Patient Position: Sitting, Cuff Size: Normal)   Pulse 62   Temp 97.7 F (36.5 C) (Oral)   Wt 138 lb 6.4 oz (62.8 kg)   LMP 11/02/2015 (Exact Date)   SpO2 100%   BMI 21.68 kg/m  Wt Readings from Last 3 Encounters:  11/28/15 138 lb 6.4 oz (62.8 kg)  11/11/15 137 lb 8 oz (62.4 kg)  12/25/14 136 lb (61.7 kg)     Lab Results  Component Value Date   WBC 7.7 11/28/2015   HGB 13.6 11/28/2015   HCT 40.5 11/28/2015   PLT 276.0 11/28/2015   GLUCOSE 86 11/28/2015   CHOL 152 11/28/2015   TRIG 45.0 11/28/2015   HDL 80.30 11/28/2015   LDLCALC 63 11/28/2015   ALT 16 11/28/2015   AST 21 11/28/2015   NA 138 11/28/2015   K 3.7 11/28/2015   CL 105 11/28/2015   CREATININE 0.61 11/28/2015   BUN 16 11/28/2015   CO2 28 11/28/2015   TSH 0.88 11/28/2015    Lab Results  Component Value Date   TSH 0.88 11/28/2015   Lab Results  Component Value Date   WBC 7.7 11/28/2015   HGB 13.6 11/28/2015   HCT 40.5 11/28/2015   MCV 91.9 11/28/2015   PLT 276.0 11/28/2015   Lab Results  Component Value Date   NA 138 11/28/2015   K 3.7 11/28/2015   CO2 28 11/28/2015   GLUCOSE 86 11/28/2015   BUN 16 11/28/2015   CREATININE 0.61 11/28/2015   BILITOT 0.7 11/28/2015    ALKPHOS 51 11/28/2015   AST 21 11/28/2015   ALT 16 11/28/2015   PROT 7.4 11/28/2015   ALBUMIN 4.6 11/28/2015   CALCIUM 9.6 11/28/2015   GFR 115.61 11/28/2015   Lab Results  Component Value Date   CHOL 152 11/28/2015   Lab Results  Component Value Date   HDL 80.30 11/28/2015  Lab Results  Component Value Date   LDLCALC 63 11/28/2015   Lab Results  Component Value Date   TRIG 45.0 11/28/2015   Lab Results  Component Value Date   CHOLHDL 2 11/28/2015   No results found for: HGBA1C     Assessment & Plan:   Problem List Items Addressed This Visit    Insomnia    Does not tolerate Melatonin, try L Tyrptophan. Encouraged good sleep hygiene such as dark, quiet room. No blue/green glowing lights such as computer screens in bedroom. No alcohol or stimulants in evening. Cut down on caffeine as able. Regular exercise is helpful but not just prior to bed time.       Hyperlipidemia    Encouraged heart healthy diet, increase exercise, avoid trans fats, consider a krill oil cap daily      Relevant Orders   Lipid panel (Completed)   Preventative health care    Patient encouraged to maintain heart healthy diet, regular exercise, adequate sleep. Consider daily probiotics. Take medications as prescribed. Given and reviewed copy of ACP documents from [place] of State and encouraged to complete and return      Relevant Orders   TSH (Completed)   CBC (Completed)   Comp Met (CMET) (Completed)   Lipid panel (Completed)   Abdominal pain    Mild, stable over years and transient. Likely mild scar tissue notify if worsens      Fatigue    Multifactorial due to husband's TBI and caring for young kids and working. Check labs.       Relevant Orders   TSH (Completed)   CBC (Completed)   Comp Met (CMET) (Completed)   Lipid panel (Completed)   Grief reaction    Given refill on Alprazolam prn       Other Visit Diagnoses   None.     I have discontinued Ms. Rappaport's  ALPRAZolam, ALPRAZolam, and ALPRAZolam. I am also having her start on ALPRAZolam. Additionally, I am having her maintain her Probiotic Product (PROBIOTIC DAILY PO), hyoscyamine, magic mouthwash, FLUOROPLEX, fluticasone, and cyclobenzaprine.  Meds ordered this encounter  Medications  . DISCONTD: ALPRAZolam (NIRAVAM) 0.5 MG dissolvable tablet    Sig: Take 1 tablet (0.5 mg total) by mouth 2 (two) times daily.    Dispense:  30 tablet    Refill:  2  . DISCONTD: ALPRAZolam (XANAX XR) 0.5 MG 24 hr tablet    Sig: Take 1 tablet (0.5 mg total) by mouth 2 (two) times daily.    Dispense:  30 tablet    Refill:  2  . ALPRAZolam (XANAX) 0.5 MG tablet    Sig: Take 1 tablet (0.5 mg total) by mouth 2 (two) times daily.    Dispense:  30 tablet    Refill:  2     Penni Homans, MD

## 2015-11-28 NOTE — Progress Notes (Signed)
Pre visit review using our clinic review tool, if applicable. No additional management support is needed unless otherwise documented below in the visit note. 

## 2015-11-28 NOTE — Assessment & Plan Note (Signed)
Mild, stable over years and transient. Likely mild scar tissue notify if worsens

## 2015-11-28 NOTE — Assessment & Plan Note (Addendum)
Multifactorial due to husband's TBI and caring for young kids and working. Check labs.

## 2015-11-28 NOTE — Assessment & Plan Note (Signed)
Does not tolerate Melatonin, try L Tyrptophan. Encouraged good sleep hygiene such as dark, quiet room. No blue/green glowing lights such as computer screens in bedroom. No alcohol or stimulants in evening. Cut down on caffeine as able. Regular exercise is helpful but not just prior to bed time.

## 2015-11-28 NOTE — Patient Instructions (Signed)
Preventive Care for Adults, Female A healthy lifestyle and preventive care can promote health and wellness. Preventive health guidelines for women include the following key practices.  A routine yearly physical is a good way to check with your health care provider about your health and preventive screening. It is a chance to share any concerns and updates on your health and to receive a thorough exam.  Visit your dentist for a routine exam and preventive care every 6 months. Brush your teeth twice a day and floss once a day. Good oral hygiene prevents tooth decay and gum disease.  The frequency of eye exams is based on your age, health, family medical history, use of contact lenses, and other factors. Follow your health care provider's recommendations for frequency of eye exams.  Eat a healthy diet. Foods like vegetables, fruits, whole grains, low-fat dairy products, and lean protein foods contain the nutrients you need without too many calories. Decrease your intake of foods high in solid fats, added sugars, and salt. Eat the right amount of calories for you.Get information about a proper diet from your health care provider, if necessary.  Regular physical exercise is one of the most important things you can do for your health. Most adults should get at least 150 minutes of moderate-intensity exercise (any activity that increases your heart rate and causes you to sweat) each week. In addition, most adults need muscle-strengthening exercises on 2 or more days a week.  Maintain a healthy weight. The body mass index (BMI) is a screening tool to identify possible weight problems. It provides an estimate of body fat based on height and weight. Your health care provider can find your BMI and can help you achieve or maintain a healthy weight.For adults 20 years and older:  A BMI below 18.5 is considered underweight.  A BMI of 18.5 to 24.9 is normal.  A BMI of 25 to 29.9 is considered overweight.  A  BMI of 30 and above is considered obese.  Maintain normal blood lipids and cholesterol levels by exercising and minimizing your intake of saturated fat. Eat a balanced diet with plenty of fruit and vegetables. Blood tests for lipids and cholesterol should begin at age 20 and be repeated every 5 years. If your lipid or cholesterol levels are high, you are over 50, or you are at high risk for heart disease, you may need your cholesterol levels checked more frequently.Ongoing high lipid and cholesterol levels should be treated with medicines if diet and exercise are not working.  If you smoke, find out from your health care provider how to quit. If you do not use tobacco, do not start.  Lung cancer screening is recommended for adults aged 55-80 years who are at high risk for developing lung cancer because of a history of smoking. A yearly low-dose CT scan of the lungs is recommended for people who have at least a 30-pack-year history of smoking and are a current smoker or have quit within the past 15 years. A pack year of smoking is smoking an average of 1 pack of cigarettes a day for 1 year (for example: 1 pack a day for 30 years or 2 packs a day for 15 years). Yearly screening should continue until the smoker has stopped smoking for at least 15 years. Yearly screening should be stopped for people who develop a health problem that would prevent them from having lung cancer treatment.  If you are pregnant, do not drink alcohol. If you are   are breastfeeding, be very cautious about drinking alcohol. If you are not pregnant and choose to drink alcohol, do not have more than 1 drink per day. One drink is considered to be 12 ounces (355 mL) of beer, 5 ounces (148 mL) of wine, or 1.5 ounces (44 mL) of liquor.  Avoid use of street drugs. Do not share needles with anyone. Ask for help if you need support or instructions about stopping the use of drugs.  High blood pressure causes heart disease and  increases the risk of stroke. Your blood pressure should be checked at least every 1 to 2 years. Ongoing high blood pressure should be treated with medicines if weight loss and exercise do not work.  If you are 12-58 years old, ask your health care provider if you should take aspirin to prevent strokes.  Diabetes screening is done by taking a blood sample to check your blood glucose level after you have not eaten for a certain period of time (fasting). If you are not overweight and you do not have risk factors for diabetes, you should be screened once every 3 years starting at age 76. If you are overweight or obese and you are 18-1 years of age, you should be screened for diabetes every year as part of your cardiovascular risk assessment.  Breast cancer screening is essential preventive care for women. You should practice "breast self-awareness." This means understanding the normal appearance and feel of your breasts and may include breast self-examination. Any changes detected, no matter how small, should be reported to a health care provider. Women in their 73s and 30s should have a clinical breast exam (CBE) by a health care provider as part of a regular health exam every 1 to 3 years. After age 36, women should have a CBE every year. Starting at age 50, women should consider having a mammogram (breast X-ray test) every year. Women who have a family history of breast cancer should talk to their health care provider about genetic screening. Women at a high risk of breast cancer should talk to their health care providers about having an MRI and a mammogram every year.  Breast cancer gene (BRCA)-related cancer risk assessment is recommended for women who have family members with BRCA-related cancers. BRCA-related cancers include breast, ovarian, tubal, and peritoneal cancers. Having family members with these cancers may be associated with an increased risk for harmful changes (mutations) in the breast  cancer genes BRCA1 and BRCA2. Results of the assessment will determine the need for genetic counseling and BRCA1 and BRCA2 testing.  Your health care provider may recommend that you be screened regularly for cancer of the pelvic organs (ovaries, uterus, and vagina). This screening involves a pelvic examination, including checking for microscopic changes to the surface of your cervix (Pap test). You may be encouraged to have this screening done every 3 years, beginning at age 40.  For women ages 32-65, health care providers may recommend pelvic exams and Pap testing every 3 years, or they may recommend the Pap and pelvic exam, combined with testing for human papilloma virus (HPV), every 5 years. Some types of HPV increase your risk of cervical cancer. Testing for HPV may also be done on women of any age with unclear Pap test results.  Other health care providers may not recommend any screening for nonpregnant women who are considered low risk for pelvic cancer and who do not have symptoms. Ask your health care provider if a screening pelvic exam is right  you.  If you have had past treatment for cervical cancer or a condition that could lead to cancer, you need Pap tests and screening for cancer for at least 20 years after your treatment. If Pap tests have been discontinued, your risk factors (such as having a new sexual partner) need to be reassessed to determine if screening should resume. Some women have medical problems that increase the chance of getting cervical cancer. In these cases, your health care provider may recommend more frequent screening and Pap tests.  Colorectal cancer can be detected and often prevented. Most routine colorectal cancer screening begins at the age of 50 years and continues through age 75 years. However, your health care provider may recommend screening at an earlier age if you have risk factors for colon cancer. On a yearly basis, your health care provider may provide home test kits to check  for hidden blood in the stool. Use of a small camera at the end of a tube, to directly examine the colon (sigmoidoscopy or colonoscopy), can detect the earliest forms of colorectal cancer. Talk to your health care provider about this at age 50, when routine screening begins. Direct exam of the colon should be repeated every 5-10 years through age 75 years, unless early forms of precancerous polyps or small growths are found.  People who are at an increased risk for hepatitis B should be screened for this virus. You are considered at high risk for hepatitis B if:  You were born in a country where hepatitis B occurs often. Talk with your health care provider about which countries are considered high risk.  Your parents were born in a high-risk country and you have not received a shot to protect against hepatitis B (hepatitis B vaccine).  You have HIV or AIDS.  You use needles to inject street drugs.  You live with, or have sex with, someone who has hepatitis B.  You get hemodialysis treatment.  You take certain medicines for conditions like cancer, organ transplantation, and autoimmune conditions.  Hepatitis C blood testing is recommended for all people born from 1945 through 1965 and any individual with known risks for hepatitis C.  Practice safe sex. Use condoms and avoid high-risk sexual practices to reduce the spread of sexually transmitted infections (STIs). STIs include gonorrhea, chlamydia, syphilis, trichomonas, herpes, HPV, and human immunodeficiency virus (HIV). Herpes, HIV, and HPV are viral illnesses that have no cure. They can result in disability, cancer, and death.  You should be screened for sexually transmitted illnesses (STIs) including gonorrhea and chlamydia if:  You are sexually active and are younger than 24 years.  You are older than 24 years and your health care provider tells you that you are at risk for this type of infection.  Your sexual activity has changed  since you were last screened and you are at an increased risk for chlamydia or gonorrhea. Ask your health care provider if you are at risk.  If you are at risk of being infected with HIV, it is recommended that you take a prescription medicine daily to prevent HIV infection. This is called preexposure prophylaxis (PrEP). You are considered at risk if:  You are sexually active and do not regularly use condoms or know the HIV status of your partner(s).  You take drugs by injection.  You are sexually active with a partner who has HIV.  Talk with your health care provider about whether you are at high risk of being infected with HIV. If   you choose to begin PrEP, you should first be tested for HIV. You should then be tested every 3 months for as long as you are taking PrEP.  Osteoporosis is a disease in which the bones lose minerals and strength with aging. This can result in serious bone fractures or breaks. The risk of osteoporosis can be identified using a bone density scan. Women ages 65 years and over and women at risk for fractures or osteoporosis should discuss screening with their health care providers. Ask your health care provider whether you should take a calcium supplement or vitamin D to reduce the rate of osteoporosis.  Menopause can be associated with physical symptoms and risks. Hormone replacement therapy is available to decrease symptoms and risks. You should talk to your health care provider about whether hormone replacement therapy is right for you.  Use sunscreen. Apply sunscreen liberally and repeatedly throughout the day. You should seek shade when your shadow is shorter than you. Protect yourself by wearing long sleeves, pants, a wide-brimmed hat, and sunglasses year round, whenever you are outdoors.  Once a month, do a whole body skin exam, using a mirror to look at the skin on your back. Tell your health care provider of new moles, moles that have irregular borders, moles that  are larger than a pencil eraser, or moles that have changed in shape or color.  Stay current with required vaccines (immunizations).  Influenza vaccine. All adults should be immunized every year.  Tetanus, diphtheria, and acellular pertussis (Td, Tdap) vaccine. Pregnant women should receive 1 dose of Tdap vaccine during each pregnancy. The dose should be obtained regardless of the length of time since the last dose. Immunization is preferred during the 27th-36th week of gestation. An adult who has not previously received Tdap or who does not know her vaccine status should receive 1 dose of Tdap. This initial dose should be followed by tetanus and diphtheria toxoids (Td) booster doses every 10 years. Adults with an unknown or incomplete history of completing a 3-dose immunization series with Td-containing vaccines should begin or complete a primary immunization series including a Tdap dose. Adults should receive a Td booster every 10 years.  Varicella vaccine. An adult without evidence of immunity to varicella should receive 2 doses or a second dose if she has previously received 1 dose. Pregnant females who do not have evidence of immunity should receive the first dose after pregnancy. This first dose should be obtained before leaving the health care facility. The second dose should be obtained 4-8 weeks after the first dose.  Human papillomavirus (HPV) vaccine. Females aged 13-26 years who have not received the vaccine previously should obtain the 3-dose series. The vaccine is not recommended for use in pregnant females. However, pregnancy testing is not needed before receiving a dose. If a female is found to be pregnant after receiving a dose, no treatment is needed. In that case, the remaining doses should be delayed until after the pregnancy. Immunization is recommended for any person with an immunocompromised condition through the age of 26 years if she did not get any or all doses earlier. During the  3-dose series, the second dose should be obtained 4-8 weeks after the first dose. The third dose should be obtained 24 weeks after the first dose and 16 weeks after the second dose.  Zoster vaccine. One dose is recommended for adults aged 60 years or older unless certain conditions are present.  Measles, mumps, and rubella (MMR) vaccine. Adults born   born before [birth year] generally are considered immune to measles and mumps. Adults born in [birth year] or later should have 1 or more doses of MMR vaccine unless there is a contraindication to the vaccine or there is laboratory evidence of immunity to each of the three diseases. A routine second dose of MMR vaccine should be obtained at least 28 days after the first dose for students attending postsecondary schools, health care workers, or international travelers. People who received inactivated measles vaccine or an unknown type of measles vaccine during 1963-1967 should receive 2 doses of MMR vaccine. People who received inactivated mumps vaccine or an unknown type of mumps vaccine before 1979 and are at high risk for mumps infection should consider immunization with 2 doses of MMR vaccine. For females of childbearing age, rubella immunity should be determined. If there is no evidence of immunity, females who are not pregnant should be vaccinated. If there is no evidence of immunity, females who are pregnant should delay immunization until after pregnancy. Unvaccinated health care workers born before [birth year] who lack laboratory evidence of measles, mumps, or rubella immunity or laboratory confirmation of disease should consider measles and mumps immunization with 2 doses of MMR vaccine or rubella immunization with 1 dose of MMR vaccine.  Pneumococcal 13-valent conjugate (PCV13) vaccine. When indicated, a person who is uncertain of his immunization history and has no record of immunization should receive the PCV13 vaccine. All adults 62 years of age and older  should receive this vaccine. An adult aged 39 years or older who has certain medical conditions and has not been previously immunized should receive 1 dose of PCV13 vaccine. This PCV13 should be followed with a dose of pneumococcal polysaccharide (PPSV23) vaccine. Adults who are at high risk for pneumococcal disease should obtain the PPSV23 vaccine at least 8 weeks after the dose of PCV13 vaccine. Adults older than 39 years of age who have normal immune system function should obtain the PPSV23 vaccine dose at least 1 year after the dose of PCV13 vaccine.  Pneumococcal polysaccharide (PPSV23) vaccine. When PCV13 is also indicated, PCV13 should be obtained first. All adults aged 47 years and older should be immunized. An adult younger than age 27 years who has certain medical conditions should be immunized. Any person who resides in a nursing home or long-term care facility should be immunized. An adult smoker should be immunized. People with an immunocompromised condition and certain other conditions should receive both PCV13 and PPSV23 vaccines. People with human immunodeficiency virus (HIV) infection should be immunized as soon as possible after diagnosis. Immunization during chemotherapy or radiation therapy should be avoided. Routine use of PPSV23 vaccine is not recommended for American Indians, [place] Natives, or people younger than 65 years unless there are medical conditions that require PPSV23 vaccine. When indicated, people who have unknown immunization and have no record of immunization should receive PPSV23 vaccine. One-time revaccination 5 years after the first dose of PPSV23 is recommended for people aged 19-64 years who have chronic kidney failure, nephrotic syndrome, asplenia, or immunocompromised conditions. People who received 1-2 doses of PPSV23 before age 57 years should receive another dose of PPSV23 vaccine at age 42 years or later if at least 5 years have passed since the previous dose. Doses  of PPSV23 are not needed for people immunized with PPSV23 at or after age 51 years.  Meningococcal vaccine. Adults with asplenia or persistent complement component deficiencies should receive 2 doses of quadrivalent meningococcal conjugate (MenACWY-D) vaccine. The doses should be  at least 2 months apart. Microbiologists working with certain meningococcal bacteria, military recruits, people at risk during an outbreak, and people who travel to or live in countries with a high rate of meningitis should be immunized. A first-year college student up through age 21 years who is living in a residence hall should receive a dose if she did not receive a dose on or after her 16th birthday. Adults who have certain high-risk conditions should receive one or more doses of vaccine.  Hepatitis A vaccine. Adults who wish to be protected from this disease, have certain high-risk conditions, work with hepatitis A-infected animals, work in hepatitis A research labs, or travel to or work in countries with a high rate of hepatitis A should be immunized. Adults who were previously unvaccinated and who anticipate close contact with an international adoptee during the first 60 days after arrival in the [place] from a country with a high rate of hepatitis A should be immunized.  Hepatitis B vaccine. Adults who wish to be protected from this disease, have certain high-risk conditions, may be exposed to blood or other infectious body fluids, are household contacts or sex partners of hepatitis B positive people, are clients or workers in certain care facilities, or travel to or work in countries with a high rate of hepatitis B should be immunized.  Haemophilus influenzae type b (Hib) vaccine. A previously unvaccinated person with asplenia or sickle cell disease or having a scheduled splenectomy should receive 1 dose of Hib vaccine. Regardless of previous immunization, a recipient of a hematopoietic stem cell transplant should receive a  3-dose series 6-12 months after her successful transplant. Hib vaccine is not recommended for adults with HIV infection. Preventive Services / Frequency Ages 19 to 39 years  Blood pressure check.** / Every 3-5 years.  Lipid and cholesterol check.** / Every 5 years beginning at age 20.  Clinical breast exam.** / Every 3 years for women in their 20s and 30s.  BRCA-related cancer risk assessment.** / For women who have family members with a BRCA-related cancer (breast, ovarian, tubal, or peritoneal cancers).  Pap test.** / Every 2 years from ages 21 through 29. Every 3 years starting at age 30 through age 65 or 70 with a history of 3 consecutive normal Pap tests.  HPV screening.** / Every 3 years from ages 30 through ages 65 to 70 with a history of 3 consecutive normal Pap tests.  Hepatitis C blood test.** / For any individual with known risks for hepatitis C.  Skin self-exam. / Monthly.  Influenza vaccine. / Every year.  Tetanus, diphtheria, and acellular pertussis (Tdap, Td) vaccine.** / Consult your health care provider. Pregnant women should receive 1 dose of Tdap vaccine during each pregnancy. 1 dose of Td every 10 years.  Varicella vaccine.** / Consult your health care provider. Pregnant females who do not have evidence of immunity should receive the first dose after pregnancy.  HPV vaccine. / 3 doses over 6 months, if 26 and younger. The vaccine is not recommended for use in pregnant females. However, pregnancy testing is not needed before receiving a dose.  Measles, mumps, rubella (MMR) vaccine.** / You need at least 1 dose of MMR if you were born in 1957 or later. You may also need a 2nd dose. For females of childbearing age, rubella immunity should be determined. If there is no evidence of immunity, females who are not pregnant should be vaccinated. If there is no evidence of immunity, females who are   pregnant should delay immunization until after pregnancy.  Pneumococcal  13-valent conjugate (PCV13) vaccine.** / Consult your health care provider.  Pneumococcal polysaccharide (PPSV23) vaccine.** / 1 to 2 doses if you smoke cigarettes or if you have certain conditions.  Meningococcal vaccine.** / 1 dose if you are age 19 to 21 years and a first-year college student living in a residence hall, or have one of several medical conditions, you need to get vaccinated against meningococcal disease. You may also need additional booster doses.  Hepatitis A vaccine.** / Consult your health care provider.  Hepatitis B vaccine.** / Consult your health care provider.  Haemophilus influenzae type b (Hib) vaccine.** / Consult your health care provider. Ages 40 to 64 years  Blood pressure check.** / Every year.  Lipid and cholesterol check.** / Every 5 years beginning at age 20 years.  Lung cancer screening. / Every year if you are aged 55-80 years and have a 30-pack-year history of smoking and currently smoke or have quit within the past 15 years. Yearly screening is stopped once you have quit smoking for at least 15 years or develop a health problem that would prevent you from having lung cancer treatment.  Clinical breast exam.** / Every year after age 40 years.  BRCA-related cancer risk assessment.** / For women who have family members with a BRCA-related cancer (breast, ovarian, tubal, or peritoneal cancers).  Mammogram.** / Every year beginning at age 40 years and continuing for as long as you are in good health. Consult with your health care provider.  Pap test.** / Every 3 years starting at age 30 years through age 65 or 70 years with a history of 3 consecutive normal Pap tests.  HPV screening.** / Every 3 years from ages 30 years through ages 65 to 70 years with a history of 3 consecutive normal Pap tests.  Fecal occult blood test (FOBT) of stool. / Every year beginning at age 50 years and continuing until age 75 years. You may not need to do this test if you get  a colonoscopy every 10 years.  Flexible sigmoidoscopy or colonoscopy.** / Every 5 years for a flexible sigmoidoscopy or every 10 years for a colonoscopy beginning at age 50 years and continuing until age 75 years.  Hepatitis C blood test.** / For all people born from 1945 through 1965 and any individual with known risks for hepatitis C.  Skin self-exam. / Monthly.  Influenza vaccine. / Every year.  Tetanus, diphtheria, and acellular pertussis (Tdap/Td) vaccine.** / Consult your health care provider. Pregnant women should receive 1 dose of Tdap vaccine during each pregnancy. 1 dose of Td every 10 years.  Varicella vaccine.** / Consult your health care provider. Pregnant females who do not have evidence of immunity should receive the first dose after pregnancy.  Zoster vaccine.** / 1 dose for adults aged 60 years or older.  Measles, mumps, rubella (MMR) vaccine.** / You need at least 1 dose of MMR if you were born in 1957 or later. You may also need a second dose. For females of childbearing age, rubella immunity should be determined. If there is no evidence of immunity, females who are not pregnant should be vaccinated. If there is no evidence of immunity, females who are pregnant should delay immunization until after pregnancy.  Pneumococcal 13-valent conjugate (PCV13) vaccine.** / Consult your health care provider.  Pneumococcal polysaccharide (PPSV23) vaccine.** / 1 to 2 doses if you smoke cigarettes or if you have certain conditions.  Meningococcal vaccine.** /   Consult your health care provider.  Hepatitis A vaccine.** / Consult your health care provider.  Hepatitis B vaccine.** / Consult your health care provider.  Haemophilus influenzae type b (Hib) vaccine.** / Consult your health care provider. Ages 80 years and over  Blood pressure check.** / Every year.  Lipid and cholesterol check.** / Every 5 years beginning at age 62 years.  Lung cancer  screening. / Every year if you are aged 32-80 years and have a 30-pack-year history of smoking and currently smoke or have quit within the past 15 years. Yearly screening is stopped once you have quit smoking for at least 15 years or develop a health problem that would prevent you from having lung cancer treatment.  Clinical breast exam.** / Every year after age 61 years.  BRCA-related cancer risk assessment.** / For women who have family members with a BRCA-related cancer (breast, ovarian, tubal, or peritoneal cancers).  Mammogram.** / Every year beginning at age 39 years and continuing for as long as you are in good health. Consult with your health care provider.  Pap test.** / Every 3 years starting at age 85 years through age 74 or 72 years with 3 consecutive normal Pap tests. Testing can be stopped between 65 and 70 years with 3 consecutive normal Pap tests and no abnormal Pap or HPV tests in the past 10 years.  HPV screening.** / Every 3 years from ages 55 years through ages 67 or 77 years with a history of 3 consecutive normal Pap tests. Testing can be stopped between 65 and 70 years with 3 consecutive normal Pap tests and no abnormal Pap or HPV tests in the past 10 years.  Fecal occult blood test (FOBT) of stool. / Every year beginning at age 81 years and continuing until age 22 years. You may not need to do this test if you get a colonoscopy every 10 years.  Flexible sigmoidoscopy or colonoscopy.** / Every 5 years for a flexible sigmoidoscopy or every 10 years for a colonoscopy beginning at age 67 years and continuing until age 22 years.  Hepatitis C blood test.** / For all people born from [birth year] through 1964 and any individual with known risks for hepatitis C.  Osteoporosis screening.** / A one-time screening for women ages 8 years and over and women at risk for fractures or osteoporosis.  Skin self-exam. / Monthly.  Influenza vaccine. / Every year.  Tetanus, diphtheria, and  acellular pertussis (Tdap/Td) vaccine.** / 1 dose of Td every 10 years.  Varicella vaccine.** / Consult your health care provider.  Zoster vaccine.** / 1 dose for adults aged 56 years or older.  Pneumococcal 13-valent conjugate (PCV13) vaccine.** / Consult your health care provider.  Pneumococcal polysaccharide (PPSV23) vaccine.** / 1 dose for all adults aged 15 years and older.  Meningococcal vaccine.** / Consult your health care provider.  Hepatitis A vaccine.** / Consult your health care provider.  Hepatitis B vaccine.** / Consult your health care provider.  Haemophilus influenzae type b (Hib) vaccine.** / Consult your health care provider. ** Family history and personal history of risk and conditions may change your health care provider's recommendations.   This information is not intended to replace advice given to you by your health care provider. Make sure you discuss any questions you have with your health care provider.   Document Released: 03/16/2001 Document Revised: 02/08/2014 Document Reviewed: 06/15/2010 Elsevier Interactive Patient Education [employer].

## 2015-12-01 ENCOUNTER — Other Ambulatory Visit: Payer: Self-pay | Admitting: Family Medicine

## 2015-12-01 LAB — TSH: TSH: 0.88 u[IU]/mL (ref 0.35–4.50)

## 2015-12-02 ENCOUNTER — Other Ambulatory Visit: Payer: Self-pay | Admitting: Family Medicine

## 2015-12-02 MED ORDER — TRIAMCINOLONE ACETONIDE 0.1 % MT PSTE: 1.0000 "application " | PASTE | Freq: Two times a day (BID) | OROMUCOSAL | 1 refills | Status: DC

## 2016-06-01 ENCOUNTER — Encounter: Payer: Self-pay | Admitting: Family Medicine

## 2016-06-01 ENCOUNTER — Ambulatory Visit (INDEPENDENT_AMBULATORY_CARE_PROVIDER_SITE_OTHER): Payer: 59 | Admitting: Family Medicine

## 2016-06-01 DIAGNOSIS — L578 Other skin changes due to chronic exposure to nonionizing radiation: Secondary | ICD-10-CM

## 2016-06-01 DIAGNOSIS — N761 Subacute and chronic vaginitis: Secondary | ICD-10-CM | POA: Diagnosis not present

## 2016-06-01 DIAGNOSIS — N760 Acute vaginitis: Secondary | ICD-10-CM | POA: Insufficient documentation

## 2016-06-01 DIAGNOSIS — F32A Depression, unspecified: Secondary | ICD-10-CM

## 2016-06-01 DIAGNOSIS — G4700 Insomnia, unspecified: Secondary | ICD-10-CM | POA: Diagnosis not present

## 2016-06-01 DIAGNOSIS — F419 Anxiety disorder, unspecified: Secondary | ICD-10-CM | POA: Insufficient documentation

## 2016-06-01 HISTORY — DX: Depression, unspecified: F32.A

## 2016-06-01 MED ORDER — ALPRAZOLAM 0.5 MG PO TABS: 0.5000 mg | ORAL_TABLET | Freq: Two times a day (BID) | ORAL | 5 refills | Status: DC

## 2016-06-01 NOTE — Progress Notes (Signed)
Pre visit review using our clinic review tool, if applicable. No additional management support is needed unless otherwise documented below in the visit note. 

## 2016-06-01 NOTE — Assessment & Plan Note (Signed)
Takes the Alprazolam roughly 3 x a week with good rest and wakes up feeling rested and level headed and patient . No concerning side effects.

## 2016-06-01 NOTE — Assessment & Plan Note (Signed)
Missed her annual check due to her doctor at derm but she has a couple of lesions that are concerning to her she agrees to return to derm.

## 2016-06-01 NOTE — Progress Notes (Signed)
Patient ID: Cynthia Blanchard, female   DOB: [birth year], 41 y.o.   MRN: [id number]   Subjective:  I acted as a Education administrator for Cynthia Blanchard, [place], [place]   Patient ID: Cynthia Blanchard, female    DOB: [birth year], 41 y.o.   MRN: [id number]  Chief Complaint  Patient presents with  . Follow-up    47-monthF/U from CPE.     HPI  Patient is in today for a six-month follow up from her annual examination. Patient has a Hx of hyperlipidemia, insomnia, anxiety and depression. Patient has no acute concerns noted at this time. She reports a good response to ALprazolam qhs, using it 3 nights a week with good results. No anhedonia or acute concerns. Is noting some skin changes and has not seen her dermatologist this year. She has occasional vaginitis and would like [place] help when it recurs no symptoms today. Notes some discharge, back and vaginal discomfort when it occurs. Denies CP/palp/SOB/HA/congestion/fevers/GI or GU c/o. Taking meds as prescribed  Patient Care Team: SMosie Lukes MD as PCP - General (Family Medicine)   Past Medical History:  Diagnosis Date  . Abdominal pain 09/15/2014  . Anxiety and depression 06/01/2016  . Asthma    as a child  . Cervical cancer screening 09/03/2014  . Chicken pox as a child  . [place] [address]  gestational diabetes- first pregnancy- not in the last 2 pregnancies  . Dysuria 12/09/2013  . Fatigue 11/28/2015  . Grief reaction 11/28/2015  . Hyperlipidemia 12/09/2013  . Insomnia 12/09/2013  . Preventative health care 09/03/2014  . Spondylolisthesis 12/09/2013  . Sun-damaged skin 09/15/2014  . Throat irritation 12/09/2013    Past Surgical History:  Procedure Laterality Date  . DILATION AND CURETTAGE OF UTERUS  2012  . FOOT SURGERY  8-10   right- cyst- size of a golf ball  . tube removed  2012   right tube  . tube ressected  2009   right tube    Family History  Problem Relation Age of Onset  . Hypertension Mother   . Obesity  Father   . Depression Father   . Cancer Maternal Grandmother     skin  . Other Maternal Grandmother     valve replacement  . Cancer Maternal Grandfather     colon  . Diabetes Paternal Grandmother     type 2  . Stroke Paternal Grandmother   . Cancer Paternal Grandfather     prostate  . Mental illness Brother   . HIV Brother   . Drug abuse Brother   . Other Brother     Glomerular Nephritis    Social History   Social History  . Marital status: Married    Spouse name: N/A  . Number of children: N/A  . Years of education: N/A   Occupational History  . Not on file.   Social History Main Topics  . Smoking status: Never Smoker  . Smokeless tobacco: Never Used  . Alcohol use 0.0 oz/week     Comment: glass of wine every night  . Drug use: No  . Sexual activity: Yes    Partners: Male     Comment: live with husband and 3 children. 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 Not on file   Social History Narrative  . No narrative on file    Outpatient Medications Prior to Visit  Medication Sig Dispense Refill  .  Alum & Mag Hydroxide-Simeth (MAGIC MOUTHWASH) SOLN Take 5 mLs by mouth 4 (four) times daily as needed for mouth pain. 100 mL 0  . cyclobenzaprine (FLEXERIL) 10 MG tablet Take 1 tablet (10 mg total) by mouth 3 (three) times daily as needed for muscle spasms. 30 tablet 0  . FLUOROPLEX 1 % cream     . fluticasone (FLONASE) 50 MCG/ACT nasal spray Place 2 sprays into both nostrils daily. 16 g 6  . hyoscyamine (LEVSIN SL) 0.125 MG SL tablet Place 1 tablet (0.125 mg total) under the tongue every 6 (six) hours as needed. 30 tablet 1  . Probiotic Product (PROBIOTIC DAILY PO) Take by mouth.    . triamcinolone (KENALOG) 0.1 % paste Use as directed 1 application in the mouth or throat 2 (two) times daily. 5 g 1  . ALPRAZolam (XANAX) 0.5 MG tablet Take 1 tablet (0.5 mg total) by mouth 2 (two) times daily. 30 tablet 2   No  facility-administered medications prior to visit.     Allergies  Allergen Reactions  . Latex Hives  . Penicillins Hives    Review of Systems  Constitutional: Negative for fever and malaise/fatigue.  HENT: Negative for congestion.   Eyes: Negative for blurred vision.  Respiratory: Negative for cough and shortness of breath.   Cardiovascular: Negative for chest pain, palpitations and leg swelling.  Gastrointestinal: Negative for vomiting.  Musculoskeletal: Negative for back pain.  Skin: Negative for rash.  Neurological: Negative for loss of consciousness and headaches.  Psychiatric/Behavioral: The patient has insomnia.        Objective:    Physical Exam  Constitutional: She is oriented to person, place, and time. She appears well-developed and well-nourished. No distress.  HENT:  Head: Normocephalic and atraumatic.  Eyes: Conjunctivae are normal.  Neck: Normal range of motion. No thyromegaly present.  Cardiovascular: Normal rate and regular rhythm.   Pulmonary/Chest: Effort normal and breath sounds normal. She has no wheezes.  Abdominal: Soft. Bowel sounds are normal. There is no tenderness.  Musculoskeletal: She exhibits no edema or deformity.  Neurological: She is alert and oriented to person, place, and time.  Skin: Skin is warm and dry. She is not diaphoretic.  Psychiatric: She has a normal mood and affect.    BP 108/71 (BP Location: Left Arm, Patient Position: Sitting, Cuff Size: Normal)   Pulse 64   Temp 98.1 F (36.7 C) (Oral)   Wt 138 lb 12.8 oz (63 kg)   SpO2 100% Comment: RA  BMI 21.74 kg/m  Wt Readings from Last 3 Encounters:  06/01/16 138 lb 12.8 oz (63 kg)  11/28/15 138 lb 6.4 oz (62.8 kg)  11/11/15 137 lb 8 oz (62.4 kg)   BP Readings from Last 3 Encounters:  06/01/16 108/71  11/28/15 133/69  11/11/15 120/70     Immunization History  Administered Date(s) Administered  . Influenza-Unspecified 11/01/2013, 11/26/2014  . Tdap 02/02/2008      Health Maintenance  Topic Date Due  . HIV Screening  12/02/2016 (Originally 03/01/1991)  . INFLUENZA VACCINE  09/01/2016  . PAP SMEAR  09/02/2017  . TETANUS/TDAP  02/01/2018    Lab Results  Component Value Date   WBC 7.7 11/28/2015   HGB 13.6 11/28/2015   HCT 40.5 11/28/2015   PLT 276.0 11/28/2015   GLUCOSE 86 11/28/2015   CHOL 152 11/28/2015   TRIG 45.0 11/28/2015   HDL 80.30 11/28/2015   LDLCALC 63 11/28/2015   ALT 16 11/28/2015   AST 21 11/28/2015  NA 138 11/28/2015   K 3.7 11/28/2015   CL 105 11/28/2015   CREATININE 0.61 11/28/2015   BUN 16 11/28/2015   CO2 28 11/28/2015   TSH 0.88 11/28/2015    Lab Results  Component Value Date   TSH 0.88 11/28/2015   Lab Results  Component Value Date   WBC 7.7 11/28/2015   HGB 13.6 11/28/2015   HCT 40.5 11/28/2015   MCV 91.9 11/28/2015   PLT 276.0 11/28/2015   Lab Results  Component Value Date   NA 138 11/28/2015   K 3.7 11/28/2015   CO2 28 11/28/2015   GLUCOSE 86 11/28/2015   BUN 16 11/28/2015   CREATININE 0.61 11/28/2015   BILITOT 0.7 11/28/2015   ALKPHOS 51 11/28/2015   AST 21 11/28/2015   ALT 16 11/28/2015   PROT 7.4 11/28/2015   ALBUMIN 4.6 11/28/2015   CALCIUM 9.6 11/28/2015   GFR 115.61 11/28/2015   Lab Results  Component Value Date   CHOL 152 11/28/2015   Lab Results  Component Value Date   HDL 80.30 11/28/2015   Lab Results  Component Value Date   LDLCALC 63 11/28/2015   Lab Results  Component Value Date   TRIG 45.0 11/28/2015   Lab Results  Component Value Date   CHOLHDL 2 11/28/2015   No results found for: HGBA1C       Assessment & Plan:   Problem List Items Addressed This Visit    Insomnia    Takes the Alprazolam roughly 3 x a week with good rest and wakes up feeling rested and level headed and patient . No concerning side effects.       Sun-damaged skin    Missed her annual check due to her doctor at derm but she has a couple of lesions that are concerning to her she  agrees to return to derm.      Vaginitis      I am having Ms. Cermak maintain her Probiotic Product (PROBIOTIC DAILY PO), hyoscyamine, magic mouthwash, FLUOROPLEX, fluticasone, cyclobenzaprine, triamcinolone, and ALPRAZolam.  Meds ordered this encounter  Medications  . ALPRAZolam (XANAX) 0.5 MG tablet    Sig: Take 1 tablet (0.5 mg total) by mouth 2 (two) times daily.    Dispense:  30 tablet    Refill:  5    CMA served as scribe during this visit. History, Physical and Plan performed by medical provider. Documentation and orders reviewed and attested to.  Cynthia Homans, MD

## 2016-06-01 NOTE — Assessment & Plan Note (Signed)
Encouraged heart healthy diet, increase exercise, avoid trans fats, consider a krill oil cap daily 

## 2016-06-01 NOTE — Patient Instructions (Signed)

## 2016-10-05 DIAGNOSIS — D485 Neoplasm of uncertain behavior of skin: Secondary | ICD-10-CM | POA: Diagnosis not present

## 2016-10-05 DIAGNOSIS — L570 Actinic keratosis: Secondary | ICD-10-CM | POA: Diagnosis not present

## 2016-10-05 DIAGNOSIS — D225 Melanocytic nevi of trunk: Secondary | ICD-10-CM | POA: Diagnosis not present

## 2016-12-10 ENCOUNTER — Other Ambulatory Visit (HOSPITAL_COMMUNITY)
Admission: RE | Admit: 2016-12-10 | Discharge: 2016-12-10 | Disposition: A | Discharge status: Home / Self Care | Payer: 59 | Source: Ambulatory Visit | Attending: Family Medicine | Admitting: Family Medicine

## 2016-12-10 ENCOUNTER — Encounter: Payer: Self-pay | Admitting: Family Medicine

## 2016-12-10 ENCOUNTER — Ambulatory Visit (INDEPENDENT_AMBULATORY_CARE_PROVIDER_SITE_OTHER): Payer: 59 | Admitting: Family Medicine

## 2016-12-10 ENCOUNTER — Encounter: Payer: 59 | Admitting: Family Medicine

## 2016-12-10 ENCOUNTER — Other Ambulatory Visit: Payer: Self-pay | Admitting: Family Medicine

## 2016-12-10 VITALS — BP 102/62 | HR 69 | Temp 97.9°F | Ht 67.0 in | Wt 131.2 lb

## 2016-12-10 DIAGNOSIS — Z0000 Encounter for general adult medical examination without abnormal findings: Secondary | ICD-10-CM | POA: Diagnosis not present

## 2016-12-10 DIAGNOSIS — Z1231 Encounter for screening mammogram for malignant neoplasm of breast: Secondary | ICD-10-CM

## 2016-12-10 DIAGNOSIS — R0989 Other specified symptoms and signs involving the circulatory and respiratory systems: Secondary | ICD-10-CM | POA: Diagnosis not present

## 2016-12-10 DIAGNOSIS — N898 Other specified noninflammatory disorders of vagina: Secondary | ICD-10-CM

## 2016-12-10 DIAGNOSIS — E875 Hyperkalemia: Secondary | ICD-10-CM

## 2016-12-10 DIAGNOSIS — Z1239 Encounter for other screening for malignant neoplasm of breast: Secondary | ICD-10-CM

## 2016-12-10 LAB — COMPREHENSIVE METABOLIC PANEL
ALBUMIN: 4.5 g/dL (ref 3.5–5.2)
ALK PHOS: 42 U/L (ref 39–117)
ALT: 22 U/L (ref 0–35)
AST: 25 U/L (ref 0–37)
BUN: 13 mg/dL (ref 6–23)
CO2: 27 mEq/L (ref 19–32)
Calcium: 9.8 mg/dL (ref 8.4–10.5)
Chloride: 103 mEq/L (ref 96–112)
Creatinine, Ser: 0.64 mg/dL (ref 0.40–1.20)
GFR: 108.81 mL/min (ref >60.00)
Glucose, Bld: 79 mg/dL (ref 70–99)
POTASSIUM: 5.6 meq/L — AB (ref 3.5–5.1)
Sodium: 140 mEq/L (ref 135–145)
Total Bilirubin: 1.3 mg/dL — ABNORMAL HIGH (ref 0.2–1.2)
Total Protein: 7.1 g/dL (ref 6.0–8.3)

## 2016-12-10 LAB — LIPID PANEL
Cholesterol: 162 mg/dL (ref 0–200)
HDL: 75.3 mg/dL (ref >39.00)
LDL CALC: 79 mg/dL (ref 0–99)
NONHDL: 86.52
Total CHOL/HDL Ratio: 2
Triglycerides: 37 mg/dL (ref 0.0–149.0)
VLDL: 7.4 mg/dL (ref 0.0–40.0)

## 2016-12-10 LAB — CBC
HEMATOCRIT: 42.1 % (ref 36.0–46.0)
HEMOGLOBIN: 13.6 g/dL (ref 12.0–15.0)
MCHC: 32.3 g/dL (ref 30.0–36.0)
MCV: 95.1 fl (ref 78.0–100.0)
Platelets: 308 10*3/uL (ref 150.0–400.0)
RBC: 4.43 Mil/uL (ref 3.87–5.11)
RDW: 13.7 % (ref 11.5–15.5)
WBC: 5.6 10*3/uL (ref 4.0–10.5)

## 2016-12-10 MED ORDER — ALPRAZOLAM 0.5 MG PO TABS: 0.5000 mg | ORAL_TABLET | Freq: Two times a day (BID) | ORAL | 5 refills | Status: DC

## 2016-12-10 NOTE — Patient Instructions (Signed)
I hope to get you your lab results by Monday.  Keep up the great work.  If you do not hear anything about your tests in the next week, call our office and ask for an update.   Let us know if you need anything.

## 2016-12-10 NOTE — Progress Notes (Signed)
Pre visit review using our clinic review tool, if applicable. No additional management support is needed unless otherwise documented below in the visit note. 

## 2016-12-10 NOTE — Progress Notes (Signed)
Chief Complaint  Patient presents with  . Annual Exam     Well Woman Cynthia Blanchard is here for a complete physical.   Her last physical was >1 year ago.  Current diet: in general, a "healthy" diet  . Current exercise: cardio. Weight is stable and she denies daytime fatigue. No LMP recorded. Seatbelt? Yes  Prominent pulse in stomach. No pain. Feels it more when she exercises. BM normal otherwise. She does not smoke.  Hx of vag infections. Having some discomfort and discharge. No urinary complaints or new sexual partners.   Health Maintenance Pap/HPV- Yes Mammogram- No Tetanus- Yes HIV- Yes 5  Past Medical History:  Diagnosis Date  . Abdominal pain 09/15/2014  . Anxiety and depression 06/01/2016  . Asthma    as a child  . Cervical cancer screening 09/03/2014  . Chicken pox as a child  . Diabetes mellitus without complication ([place]) [address]   gestational diabetes- first pregnancy- not in the last 2 pregnancies  . Dysuria 12/09/2013  . Fatigue 11/28/2015  . Grief reaction 11/28/2015  . Hyperlipidemia 12/09/2013  . Insomnia 12/09/2013  . Preventative health care 09/03/2014  . Spondylolisthesis 12/09/2013  . Sun-damaged skin 09/15/2014  . Throat irritation 12/09/2013    Past Surgical History:  Procedure Laterality Date  . DILATION AND CURETTAGE OF UTERUS  2012  . FOOT SURGERY  8-10   right- cyst- size of a golf ball  . tube removed  2012   right tube  . tube ressected  2009   right tube   Medications  Current Outpatient Medications on File Prior to Visit  Medication Sig Dispense Refill  . ALPRAZolam (XANAX) 0.5 MG tablet Take 1 tablet (0.5 mg total) by mouth 2 (two) times daily. 30 tablet 5  . Alum & Mag Hydroxide-Simeth (MAGIC MOUTHWASH) SOLN Take 5 mLs by mouth 4 (four) times daily as needed for mouth pain. 100 mL 0  . cyclobenzaprine (FLEXERIL) 10 MG tablet Take 1 tablet (10 mg total) by mouth 3 (three) times daily as needed for muscle spasms. 30 tablet 0  . FLUOROPLEX 1 %  cream     . fluticasone (FLONASE) 50 MCG/ACT nasal spray Place 2 sprays into both nostrils daily. 16 g 6  . hyoscyamine (LEVSIN SL) 0.125 MG SL tablet Place 1 tablet (0.125 mg total) under the tongue every 6 (six) hours as needed. 30 tablet 1  . Probiotic Product (PROBIOTIC DAILY PO) Take by mouth.    . triamcinolone (KENALOG) 0.1 % paste Use as directed 1 application in the mouth or throat 2 (two) times daily. 5 g 1   Allergies Allergies  Allergen Reactions  . Latex Hives  . Penicillins Hives    Review of Systems: Constitutional:  no unexpected change in weight, no weakness, no unexplained fevers, sweats, or chills Eye:  no recent significant change in vision Ear/Nose/Mouth/Throat:  Ears:  no tinnitus or vertigo and no recent change in hearing, Nose/Mouth/Throat:  no complaints of nasal congestion or discharge, no sore throat and no recent change in voice or hoarseness Cardiovascular:  no exercise intolerance, no chest pain, no palpitations Respiratory:  no chronic cough, sputum, or hemoptysis and no shortness of breath Gastrointestinal: +prom abd pulse; no abdominal pain, no change in bowel habits, no significant change in appetite, no nausea, vomiting, diarrhea, or constipation and no black or bloody stool GU:  Female: negative for dysuria, frequency, and incontinence,no abnormal bleeding, +vag discharge Musculoskeletal/Extremities:  no pain, redness, or swelling of the joints  Integumentary (Skin/Breast):  no abnormal skin lesions reported, no new breast lumps or masses Neurologic:  no chronic headaches, no numbness, tingling, or tremor Psychiatric:  no anxiety, no depression Endocrine:  denies fatigue, weight changes, heat/cold intolerance, bowel or skin changes, or cardiovascular system symptoms Hematologic/Lymphatic:  no abnormal bleeding, no HIV risk factors, no night sweats, no swollen nodes, no weight loss Allergic/Immunologic:  no history of food or environmental  allergies  Exam BP 102/62 (BP Location: Left Arm, Patient Position: Sitting, Cuff Size: Normal)   Pulse 69   Temp 97.9 F (36.6 C) (Oral)   Ht 5' 7"  (1.702 m)   Wt 131 lb 4 oz (59.5 kg)   SpO2 99%   BMI 20.56 kg/m  General:  well developed, well nourished, in no apparent distress Skin:  no significant moles, warts, or growths Head:  no masses, lesions, or tenderness Eyes:  pupils equal and round, sclera anicteric without injection Ears:  canals without lesions, TMs shiny without retraction, no obvious effusion, no erythema Nose:  nares patent, septum midline, mucosa normal, and no drainage or sinus tenderness Throat/Pharynx:  lips and gingiva without lesion; tongue and uvula midline; non-inflamed pharynx; no exudates or postnasal drainage Neck: neck supple without adenopathy, thyromegaly, or masses Breasts:  inspection negative, no nipple discharge or bleeding, no masses or nodularity palpable Thorax:  nontender Lungs:  clear to auscultation, breath sounds equal bilaterally, no respiratory distress Cardio:  regular rate and rhythm without murmurs, heart sounds without clicks or rubs, point of maximal impulse normal; no lifts, heaves, or thrills Abdomen: AA pulse present most prominent over linea alba; otherwise abdomen soft, nontender; bowel sounds normal; no masses or organomegaly Genital: Not done Musculoskeletal:  symmetrical muscle groups noted without atrophy or deformity Extremities:  no clubbing, cyanosis, or edema, no deformities, no skin discoloration Neuro:  gait normal; deep tendon reflexes normal and symmetric Psych: well oriented with normal range of affect and appropriate judgment/insight  Assessment and Plan  Well adult exam - Plan: Lipid panel, CBC, Comprehensive metabolic panel  Prominent abdominal aortic pulse - Plan: US Aorta  Screening for malignant neoplasm of breast - Plan: MM DIGITAL SCREENING BILATERAL  Vaginal discharge - Plan: Urine cytology ancillary  only   Well 41 y.o. female. Counseled on diet and exercise. She is doing well. Refill Xanax on behalf of PCP. Other orders as above. Will check urine. Will order [place] of abd for aorta, much for her piece of mind as she is a slender individual.  Ideal Lean- Pt drank this and started having burning pain on the bottom of her L foot. This is continuing to resolve since she stopped drinking it.  Follow up in 6 mo with Dr. Charlett Blake for med check. The patient voiced understanding and agreement to the plan.  [place], DO 12/10/16 9:12 AM

## 2016-12-11 ENCOUNTER — Other Ambulatory Visit: Payer: Self-pay | Admitting: Family Medicine

## 2016-12-11 ENCOUNTER — Ambulatory Visit (HOSPITAL_BASED_OUTPATIENT_CLINIC_OR_DEPARTMENT_OTHER)
Admission: RE | Admit: 2016-12-11 | Discharge: 2016-12-11 | Disposition: A | Discharge status: Home / Self Care | Payer: 59 | Source: Ambulatory Visit | Attending: Family Medicine | Admitting: Family Medicine

## 2016-12-11 DIAGNOSIS — Z1231 Encounter for screening mammogram for malignant neoplasm of breast: Secondary | ICD-10-CM | POA: Diagnosis not present

## 2016-12-11 DIAGNOSIS — Z1239 Encounter for other screening for malignant neoplasm of breast: Secondary | ICD-10-CM

## 2016-12-11 DIAGNOSIS — R0989 Other specified symptoms and signs involving the circulatory and respiratory systems: Secondary | ICD-10-CM

## 2016-12-11 DIAGNOSIS — I714 Abdominal aortic aneurysm, without rupture: Secondary | ICD-10-CM | POA: Diagnosis not present

## 2016-12-13 LAB — URINE CYTOLOGY ANCILLARY ONLY: TRICH (WINDOWPATH): NEGATIVE

## 2016-12-14 ENCOUNTER — Other Ambulatory Visit (INDEPENDENT_AMBULATORY_CARE_PROVIDER_SITE_OTHER): Payer: 59

## 2016-12-14 DIAGNOSIS — E875 Hyperkalemia: Secondary | ICD-10-CM | POA: Diagnosis not present

## 2016-12-14 LAB — BASIC METABOLIC PANEL
BUN: 13 mg/dL (ref 6–23)
CALCIUM: 9.2 mg/dL (ref 8.4–10.5)
CO2: 28 mEq/L (ref 19–32)
Chloride: 104 mEq/L (ref 96–112)
Creatinine, Ser: 0.56 mg/dL (ref 0.40–1.20)
GFR: 126.93 mL/min (ref >60.00)
GLUCOSE: 77 mg/dL (ref 70–99)
POTASSIUM: 3.7 meq/L (ref 3.5–5.1)
SODIUM: 138 meq/L (ref 135–145)

## 2016-12-15 LAB — URINE CYTOLOGY ANCILLARY ONLY
BACTERIAL VAGINITIS: NEGATIVE
CANDIDA VAGINITIS: NEGATIVE

## 2017-06-03 ENCOUNTER — Encounter: Payer: Self-pay | Admitting: Family Medicine

## 2017-06-03 ENCOUNTER — Ambulatory Visit (INDEPENDENT_AMBULATORY_CARE_PROVIDER_SITE_OTHER): Payer: 59 | Admitting: Family Medicine

## 2017-06-03 VITALS — BP 107/65 | Temp 98.2°F | Resp 16 | Ht 66.93 in | Wt 129.6 lb

## 2017-06-03 DIAGNOSIS — Z79899 Other long term (current) drug therapy: Secondary | ICD-10-CM

## 2017-06-03 DIAGNOSIS — L578 Other skin changes due to chronic exposure to nonionizing radiation: Secondary | ICD-10-CM | POA: Diagnosis not present

## 2017-06-03 DIAGNOSIS — F419 Anxiety disorder, unspecified: Secondary | ICD-10-CM

## 2017-06-03 MED ORDER — ALPRAZOLAM 0.5 MG PO TABS: 0.5000 mg | ORAL_TABLET | Freq: Two times a day (BID) | ORAL | 3 refills | Status: DC

## 2017-06-03 NOTE — Assessment & Plan Note (Signed)
Is following with Dr Altamese Cabal and has a scaly lesion on her right ear that is not fully resolved with topical treatment she is encouraged to return to derm.

## 2017-06-03 NOTE — Patient Instructions (Addendum)

## 2017-06-03 NOTE — Progress Notes (Signed)
Subjective:  I acted as a Education administrator for [employer]. Yancey Flemings, [place]   Patient ID: Cynthia Blanchard, female    DOB: Sep 08, 1975, 42 y.o.   MRN: [id number]  Chief Complaint  Patient presents with  . Follow-up    HPI  Patient is in today for follow up visit. She is doing well. No recent febrile illness or hospitalizations. She uses alprazolam infrequently with good results. No concerning side effects. Denies CP/palp/SOB/HA/congestion/fevers/GI or GU c/o. Taking meds as prescribed  Patient Care Team: Mosie Lukes, MD as PCP - General (Family Medicine)   Past Medical History:  Diagnosis Date  . Abdominal pain 09/15/2014  . Anxiety and depression 06/01/2016  . Asthma    as a child  . Cervical cancer screening 09/03/2014  . Chicken pox as a child  . Diabetes mellitus without complication ([place]) 6712   gestational diabetes- first pregnancy- not in the last 2 pregnancies  . Dysuria 12/09/2013  . Fatigue 11/28/2015  . Grief reaction 11/28/2015  . Hyperlipidemia 12/09/2013  . Insomnia 12/09/2013  . Preventative health care 09/03/2014  . Spondylolisthesis 12/09/2013  . Sun-damaged skin 09/15/2014  . Throat irritation 12/09/2013    Past Surgical History:  Procedure Laterality Date  . DILATION AND CURETTAGE OF UTERUS  2012  . FOOT SURGERY  8-10   right- cyst- size of a golf ball  . tube removed  2012   right tube  . tube ressected  2009   right tube    Family History  Problem Relation Age of Onset  . Hypertension Mother   . Obesity Father   . Depression Father   . Cancer Maternal Grandmother        skin  . Other Maternal Grandmother        valve replacement  . Cancer Maternal Grandfather        colon  . Diabetes Paternal Grandmother        type 2  . Stroke Paternal Grandmother   . Cancer Paternal Grandfather        prostate  . Mental illness Brother   . HIV Brother   . Drug abuse Brother   . Other Brother        Glomerular Nephritis    Social History   Socioeconomic History  .  Marital status: Married    Spouse name: Not on file  . Number of children: Not on file  . Years of education: Not on file  . Highest education level: Not on file  Occupational History  . Not on file  Social Needs  . Financial resource strain: Not on file  . Food insecurity:    Worry: Not on file    Inability: Not on file  . Transportation needs:    Medical: Not on file    Non-medical: Not on file  Tobacco Use  . Smoking status: Never Smoker  . Smokeless tobacco: Never Used  Substance and Sexual Activity  . Alcohol use: Yes    Alcohol/week: 0.0 oz    Comment: glass of wine every night  . Drug use: No  . 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 Physical activity:    Days per week: Not on file    Minutes per session: Not on file  . Stress: Not on file  Relationships  . Social connections:    Talks on phone: Not  on file    Gets together: Not on file    Attends religious service: Not on file    Active member of club or organization: Not on file    Attends meetings of clubs or organizations: Not on file    Relationship status: Not on file  . Intimate partner violence:    Fear of current or ex partner: Not on file    Emotionally abused: Not on file    Physically abused: Not on file    Forced sexual activity: Not on file  Other Topics Concern  . Not on file  Social History Narrative  . Not on file    Outpatient Medications Prior to Visit  Medication Sig Dispense Refill  . Alum & Mag Hydroxide-Simeth (MAGIC MOUTHWASH) SOLN Take 5 mLs by mouth 4 (four) times daily as needed for mouth pain. 100 mL 0  . cyclobenzaprine (FLEXERIL) 10 MG tablet Take 1 tablet (10 mg total) by mouth 3 (three) times daily as needed for muscle spasms. 30 tablet 0  . FLUOROPLEX 1 % cream     . fluticasone (FLONASE) 50 MCG/ACT nasal spray Place 2 sprays into both nostrils daily. 16 g 6   . hyoscyamine (LEVSIN SL) 0.125 MG SL tablet Place 1 tablet (0.125 mg total) under the tongue every 6 (six) hours as needed. 30 tablet 1  . Probiotic Product (PROBIOTIC DAILY PO) Take by mouth.    . triamcinolone (KENALOG) 0.1 % paste Use as directed 1 application in the mouth or throat 2 (two) times daily. 5 g 1  . ALPRAZolam (XANAX) 0.5 MG tablet Take 1 tablet (0.5 mg total) 2 (two) times daily by mouth. 30 tablet 5   No facility-administered medications prior to visit.     Allergies  Allergen Reactions  . Latex Hives  . Penicillins Hives    Review of Systems  Constitutional: Negative for fever and malaise/fatigue.  HENT: Negative for congestion.   Eyes: Negative for blurred vision.  Respiratory: Negative for shortness of breath.   Cardiovascular: Negative for chest pain, palpitations and leg swelling.  Gastrointestinal: Negative for abdominal pain, blood in stool and nausea.  Genitourinary: Negative for dysuria and frequency.  Musculoskeletal: Negative for falls.  Skin: Positive for rash.  Neurological: Negative for dizziness, loss of consciousness and headaches.  Endo/Heme/Allergies: Negative for environmental allergies.  Psychiatric/Behavioral: Negative for depression. The patient is nervous/anxious.        Objective:    Physical Exam  Constitutional: She is oriented to person, place, and time. No distress.  HENT:  Head: Normocephalic and atraumatic.  Eyes: Conjunctivae are normal.  Neck: Neck supple. No thyromegaly present.  Cardiovascular: Normal rate, regular rhythm and normal heart sounds.  No murmur heard. Pulmonary/Chest: Effort normal and breath sounds normal. She has no wheezes.  Abdominal: She exhibits no distension and no mass.  Musculoskeletal: She exhibits no edema.  Lymphadenopathy:    She has no cervical adenopathy.  Neurological: She is alert and oriented to person, place, and time.  Skin: Skin is warm and dry. No rash noted. She is not diaphoretic.   Psychiatric: Judgment normal.    BP 107/65 (BP Location: Left Arm, Patient Position: Sitting, Cuff Size: Normal)   Temp 98.2 F (36.8 C) (Oral)   Resp 16   Ht 5' 6.93" (1.7 m)   Wt 129 lb 9.6 oz (58.8 kg)   BMI 20.34 kg/m  Wt Readings from Last 3 Encounters:  06/03/17 129 lb 9.6 oz (58.8 kg)  12/10/16  131 lb 4 oz (59.5 kg)  06/01/16 138 lb 12.8 oz (63 kg)   BP Readings from Last 3 Encounters:  06/03/17 107/65  12/10/16 102/62  06/01/16 108/71     Immunization History  Administered Date(s) Administered  . Influenza-Unspecified 11/01/2013, 11/26/2014  . Tdap 02/02/2008    Health Maintenance  Topic Date Due  . HIV Screening  03/01/1991  . INFLUENZA VACCINE  09/01/2017  . PAP SMEAR  09/02/2017  . TETANUS/TDAP  02/01/2018    Lab Results  Component Value Date   WBC 5.6 12/10/2016   HGB 13.6 12/10/2016   HCT 42.1 12/10/2016   PLT 308.0 12/10/2016   GLUCOSE 77 12/14/2016   CHOL 162 12/10/2016   TRIG 37.0 12/10/2016   HDL 75.30 12/10/2016   LDLCALC 79 12/10/2016   ALT 22 12/10/2016   AST 25 12/10/2016   NA 138 12/14/2016   K 3.7 12/14/2016   CL 104 12/14/2016   CREATININE 0.56 12/14/2016   BUN 13 12/14/2016   CO2 28 12/14/2016   TSH 0.88 11/28/2015    Lab Results  Component Value Date   TSH 0.88 11/28/2015   Lab Results  Component Value Date   WBC 5.6 12/10/2016   HGB 13.6 12/10/2016   HCT 42.1 12/10/2016   MCV 95.1 12/10/2016   PLT 308.0 12/10/2016   Lab Results  Component Value Date   NA 138 12/14/2016   K 3.7 12/14/2016   CO2 28 12/14/2016   GLUCOSE 77 12/14/2016   BUN 13 12/14/2016   CREATININE 0.56 12/14/2016   BILITOT 1.3 (H) 12/10/2016   ALKPHOS 42 12/10/2016   AST 25 12/10/2016   ALT 22 12/10/2016   PROT 7.1 12/10/2016   ALBUMIN 4.5 12/10/2016   CALCIUM 9.2 12/14/2016   GFR 126.93 12/14/2016   Lab Results  Component Value Date   CHOL 162 12/10/2016   Lab Results  Component Value Date   HDL 75.30 12/10/2016   Lab Results    Component Value Date   LDLCALC 79 12/10/2016   Lab Results  Component Value Date   TRIG 37.0 12/10/2016   Lab Results  Component Value Date   CHOLHDL 2 12/10/2016   No results found for: HGBA1C       Assessment & Plan:   Problem List Items Addressed This Visit    Sun-damaged skin    Is following with Dr Altamese Cabal and has a scaly lesion on her right ear that is not fully resolved with topical treatment she is encouraged to return to derm.      Anxiety    Is doing well with infrequent use of Alprazolam may continue refills, UDS and contract up to date. She understands to take infrequently. Keep close track of the medication and let us know if any concerns      Relevant Medications   ALPRAZolam (XANAX) 0.5 MG tablet    Other Visit Diagnoses    High risk medication use    -  Primary   Relevant Orders   Pain Mgmt, Profile 8 w/Conf, U      I have changed Clarise Cruz Chipley's ALPRAZolam. I am also having her maintain her Probiotic Product (PROBIOTIC DAILY PO), hyoscyamine, magic mouthwash, FLUOROPLEX, fluticasone, cyclobenzaprine, and triamcinolone.  Meds ordered this encounter  Medications  . ALPRAZolam (XANAX) 0.5 MG tablet    Sig: Take 1 tablet (0.5 mg total) by mouth 2 (two) times daily.    Dispense:  30 tablet    Refill:  3  CMA served as Education administrator during this visit. History, Physical and Plan performed by medical provider. Documentation and orders reviewed and attested to.  Penni Homans, MD

## 2017-06-03 NOTE — Assessment & Plan Note (Signed)
Is doing well with infrequent use of Alprazolam may continue refills, UDS and contract up to date. She understands to take infrequently. Keep close track of the medication and let us know if any concerns

## 2017-06-06 LAB — PAIN MGMT, PROFILE 8 W/CONF, U
6 Acetylmorphine: NEGATIVE ng/mL (ref <10)
ALPHAHYDROXYALPRAZOLAM: NEGATIVE ng/mL (ref <25)
ALPHAHYDROXYMIDAZOLAM: NEGATIVE ng/mL (ref <50)
AMINOCLONAZEPAM: NEGATIVE ng/mL (ref <25)
AMPHETAMINES: NEGATIVE ng/mL (ref <500)
Alcohol Metabolites: NEGATIVE ng/mL (ref <500)
Alphahydroxytriazolam: NEGATIVE ng/mL (ref <50)
BENZODIAZEPINES: NEGATIVE ng/mL (ref <100)
BUPRENORPHINE, URINE: NEGATIVE ng/mL (ref <5)
Cocaine Metabolite: NEGATIVE ng/mL (ref <150)
Creatinine: 18.2 mg/dL — ABNORMAL LOW
HYDROXYETHYLFLURAZEPAM: NEGATIVE ng/mL (ref <50)
LORAZEPAM: NEGATIVE ng/mL (ref <50)
MARIJUANA METABOLITE: NEGATIVE ng/mL (ref <20)
MDMA: NEGATIVE ng/mL (ref <500)
NORDIAZEPAM: NEGATIVE ng/mL (ref <50)
OXAZEPAM: NEGATIVE ng/mL (ref <50)
OXYCODONE: NEGATIVE ng/mL (ref <100)
Opiates: NEGATIVE ng/mL (ref <100)
Oxidant: NEGATIVE ug/mL (ref <200)
PH: 5.87 (ref 4.5–9.0)
Specific Gravity: 1.005 (ref >=1.0)
Temazepam: NEGATIVE ng/mL (ref <50)

## 2017-06-10 ENCOUNTER — Ambulatory Visit: Payer: 59 | Admitting: Family Medicine

## 2017-10-23 DIAGNOSIS — R109 Unspecified abdominal pain: Secondary | ICD-10-CM | POA: Diagnosis not present

## 2017-10-23 DIAGNOSIS — R300 Dysuria: Secondary | ICD-10-CM | POA: Diagnosis not present

## 2017-10-23 IMAGING — DX DG THORACIC SPINE 2V
3 series · 3 of 3 positions shown · non-contrast
Comparison: None.

CLINICAL DATA: Fall, twisting motion, back pain

EXAM:
THORACIC SPINE 2 VIEWS

[t-spine ap]
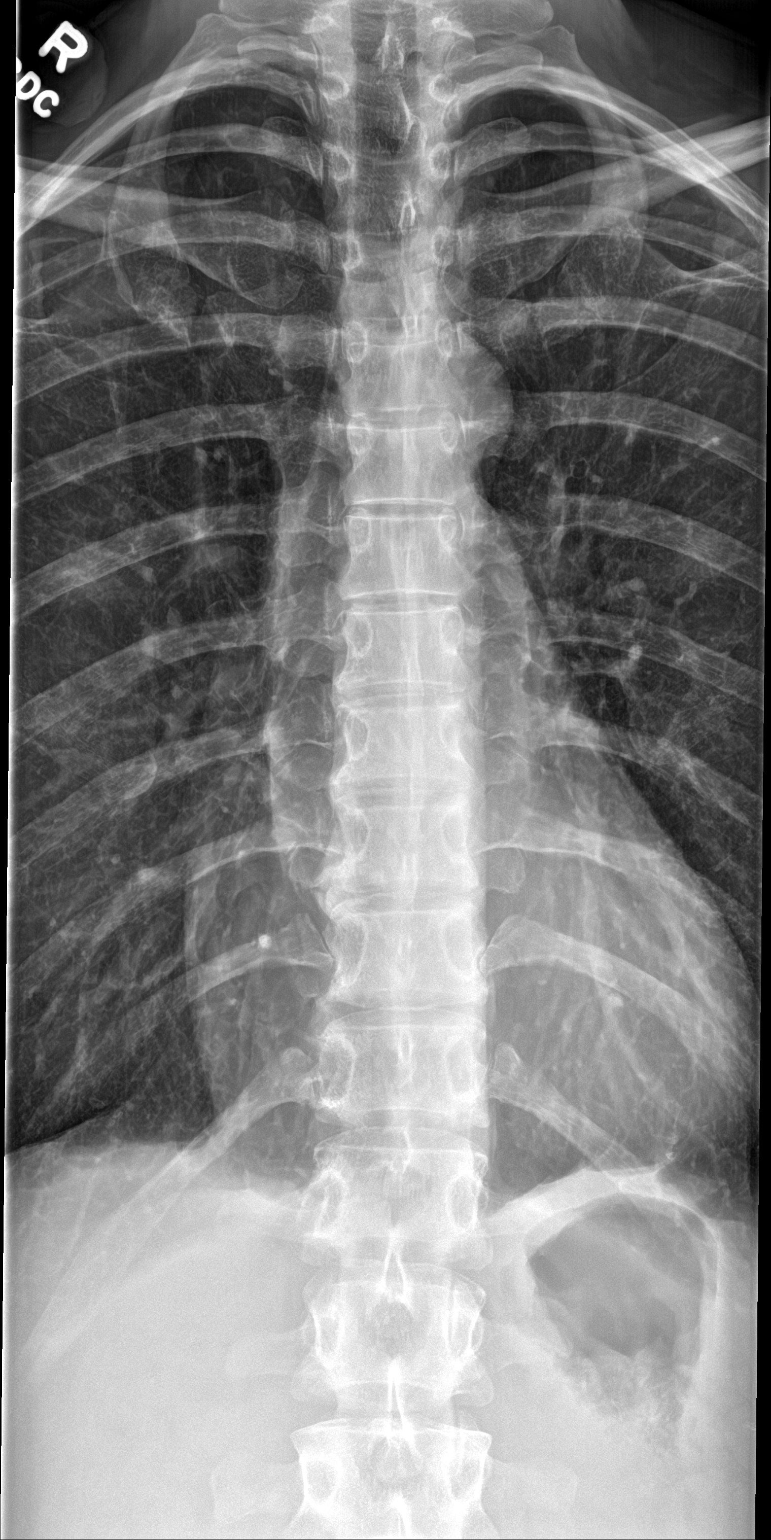

[t-spine lat]
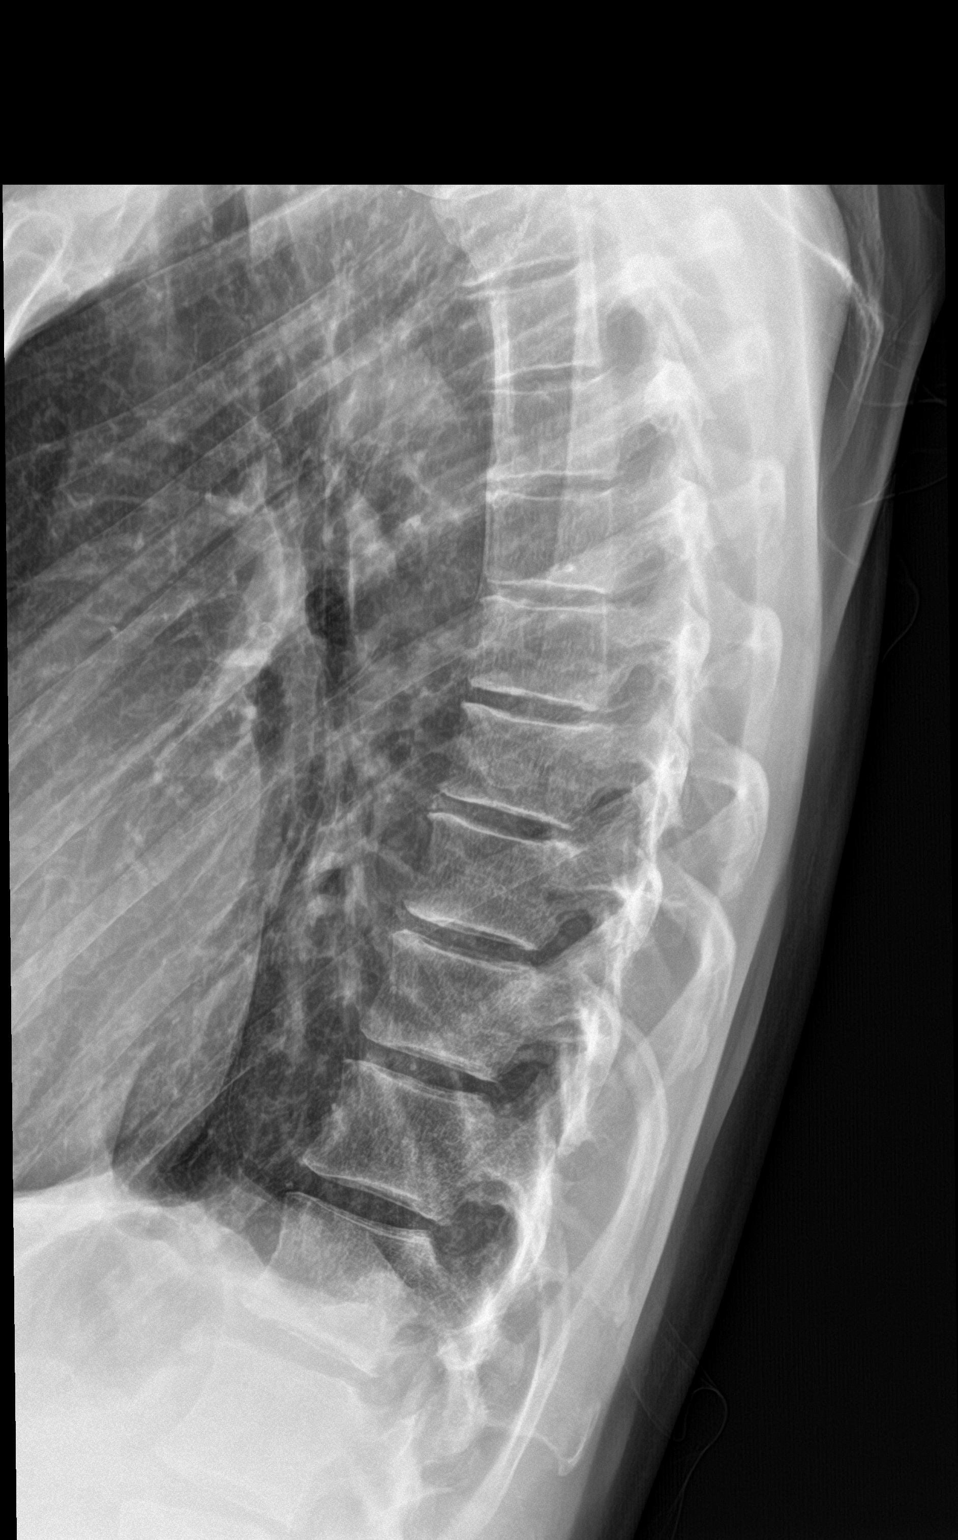

[t-spine swimmers]
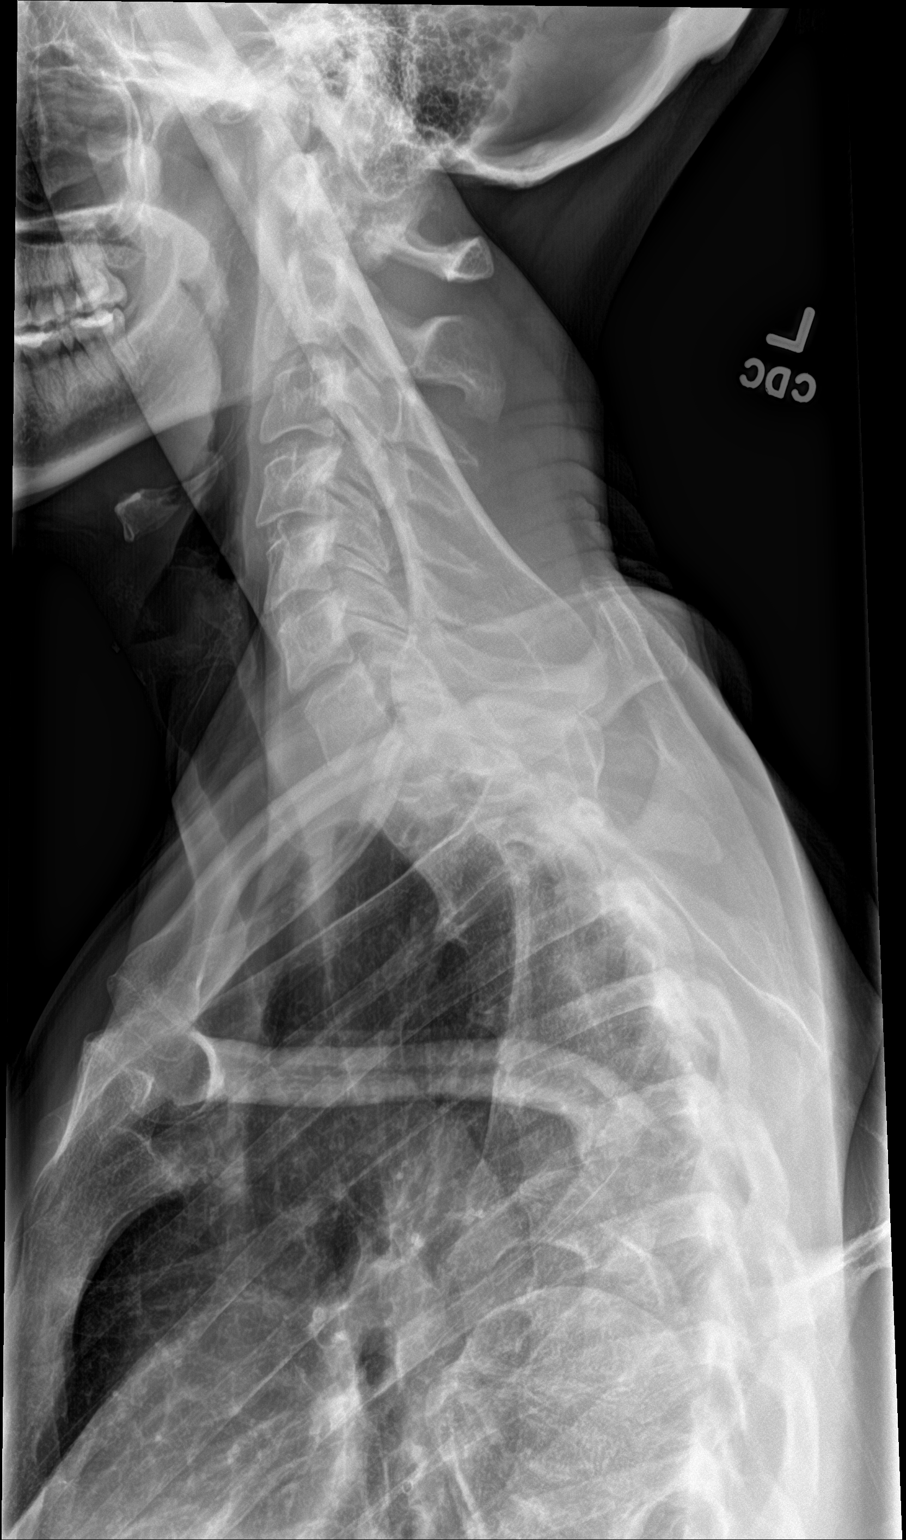

[3 of 3 positions shown; findings below may reference images not displayed]

FINDINGS: Three views of thoracic spine submitted. No acute fracture or
subluxation. Alignment and vertebral body heights are preserved.
Minimal degenerative changes with anterior spurring mid thoracic
spine.
IMPRESSION: Negative.

## 2017-10-27 ENCOUNTER — Encounter: Payer: Self-pay | Admitting: Family Medicine

## 2017-10-27 ENCOUNTER — Ambulatory Visit (INDEPENDENT_AMBULATORY_CARE_PROVIDER_SITE_OTHER): Payer: 59 | Admitting: Family Medicine

## 2017-10-27 ENCOUNTER — Other Ambulatory Visit (HOSPITAL_COMMUNITY)
Admission: RE | Admit: 2017-10-27 | Discharge: 2017-10-27 | Disposition: A | Discharge status: Home / Self Care | Payer: 59 | Source: Ambulatory Visit | Attending: Family Medicine | Admitting: Family Medicine

## 2017-10-27 VITALS — BP 132/84 | HR 69 | Temp 98.0°F | Resp 18 | Wt 129.4 lb

## 2017-10-27 DIAGNOSIS — N761 Subacute and chronic vaginitis: Secondary | ICD-10-CM | POA: Diagnosis not present

## 2017-10-27 DIAGNOSIS — R109 Unspecified abdominal pain: Secondary | ICD-10-CM | POA: Diagnosis not present

## 2017-10-27 DIAGNOSIS — F419 Anxiety disorder, unspecified: Secondary | ICD-10-CM | POA: Diagnosis not present

## 2017-10-27 DIAGNOSIS — J069 Acute upper respiratory infection, unspecified: Secondary | ICD-10-CM

## 2017-10-27 LAB — URINALYSIS
Bilirubin Urine: NEGATIVE
Hgb urine dipstick: NEGATIVE
Ketones, ur: NEGATIVE
Leukocytes, UA: NEGATIVE
NITRITE: NEGATIVE
PH: 5.5 (ref 5.0–8.0)
Specific Gravity, Urine: 1.015 (ref 1.000–1.030)
TOTAL PROTEIN, URINE-UPE24: NEGATIVE
Urine Glucose: NEGATIVE
Urobilinogen, UA: 0.2 (ref 0.0–1.0)

## 2017-10-27 LAB — COMPREHENSIVE METABOLIC PANEL
ALBUMIN: 4.7 g/dL (ref 3.5–5.2)
ALT: 18 U/L (ref 0–35)
AST: 22 U/L (ref 0–37)
Alkaline Phosphatase: 56 U/L (ref 39–117)
BUN: 15 mg/dL (ref 6–23)
CHLORIDE: 104 meq/L (ref 96–112)
CO2: 28 meq/L (ref 19–32)
CREATININE: 0.68 mg/dL (ref 0.40–1.20)
Calcium: 9.4 mg/dL (ref 8.4–10.5)
GFR: 101.02 mL/min (ref >60.00)
Glucose, Bld: 85 mg/dL (ref 70–99)
Potassium: 3.9 mEq/L (ref 3.5–5.1)
SODIUM: 138 meq/L (ref 135–145)
Total Bilirubin: 0.7 mg/dL (ref 0.2–1.2)
Total Protein: 7.5 g/dL (ref 6.0–8.3)

## 2017-10-27 LAB — CBC WITH DIFFERENTIAL/PLATELET
BASOS ABS: 0 10*3/uL (ref 0.0–0.1)
BASOS PCT: 0.8 % (ref 0.0–3.0)
EOS ABS: 0.1 10*3/uL (ref 0.0–0.7)
Eosinophils Relative: 1 % (ref 0.0–5.0)
HCT: 41.1 % (ref 36.0–46.0)
HEMOGLOBIN: 13.8 g/dL (ref 12.0–15.0)
Lymphocytes Relative: 28.5 % (ref 12.0–46.0)
Lymphs Abs: 1.6 10*3/uL (ref 0.7–4.0)
MCHC: 33.7 g/dL (ref 30.0–36.0)
MCV: 93.3 fl (ref 78.0–100.0)
MONO ABS: 0.4 10*3/uL (ref 0.1–1.0)
Monocytes Relative: 7.1 % (ref 3.0–12.0)
Neutro Abs: 3.5 10*3/uL (ref 1.4–7.7)
Neutrophils Relative %: 62.6 % (ref 43.0–77.0)
PLATELETS: 295 10*3/uL (ref 150.0–400.0)
RBC: 4.41 Mil/uL (ref 3.87–5.11)
RDW: 13.8 % (ref 11.5–15.5)
WBC: 5.5 10*3/uL (ref 4.0–10.5)

## 2017-10-27 LAB — SEDIMENTATION RATE: Sed Rate: 1 mm/hr (ref 0–20)

## 2017-10-27 MED ORDER — METRONIDAZOLE 500 MG PO TABS: 500.0000 mg | ORAL_TABLET | Freq: Three times a day (TID) | ORAL | 0 refills | Status: DC

## 2017-10-27 NOTE — Patient Instructions (Signed)
Abdominal Pain, Adult Abdominal pain can be caused by many things. Often, abdominal pain is not serious and it gets better with no treatment or by being treated at home. However, sometimes abdominal pain is serious. Your health care provider will do a medical history and a physical exam to try to determine the cause of your abdominal pain. Follow these instructions at home:  Take over-the-counter and prescription medicines only as told by your health care provider. Do not take a laxative unless told by your health care provider.  Drink enough fluid to keep your urine clear or pale yellow.  Watch your condition for any changes.  Keep all follow-up visits as told by your health care provider. This is important. Contact a health care provider if:  Your abdominal pain changes or gets worse.  You are not hungry or you lose weight without trying.  You are constipated or have diarrhea for more than 2-3 days.  You have pain when you urinate or have a bowel movement.  Your abdominal pain wakes you up at night.  Your pain gets worse with meals, after eating, or with certain foods.  You are throwing up and cannot keep anything down.  You have a fever. Get help right away if:  Your pain does not go away as soon as your health care provider told you to expect.  You cannot stop throwing up.  Your pain is only in areas of the abdomen, such as the right side or the left lower portion of the abdomen.  You have bloody or black stools, or stools that look like tar.  You have severe pain, cramping, or bloating in your abdomen.  You have signs of dehydration, such as: ? Dark urine, very little urine, or no urine. ? Cracked lips. ? Dry mouth. ? Sunken eyes. ? Sleepiness. ? Weakness. This information is not intended to replace advice given to you by your health care provider. Make sure you discuss any questions you have with your health care provider. Document Released: 10/28/2004 Document  Revised: 08/08/2015 Document Reviewed: 07/02/2015 Elsevier Interactive Patient Education  [employer].

## 2017-10-27 NOTE — Progress Notes (Signed)
junm

## 2017-10-30 DIAGNOSIS — J069 Acute upper respiratory infection, unspecified: Secondary | ICD-10-CM | POA: Insufficient documentation

## 2017-10-30 NOTE — Assessment & Plan Note (Signed)
Avoid heavy soaps, take a probiotic and check ancillary testing.

## 2017-10-30 NOTE — Assessment & Plan Note (Signed)
Very anxious about he symptoms. Discussed at length the fact that her symptoms have not been severe and have  Only been present one week. She is encouraged to let us know if symptoms persist or worsen so we can consider moving onto imaging to rule out any significant pathology. She agrees to avoid radiation for the time being and wait a week or two before proceed ing unless symptoms worsen

## 2017-10-30 NOTE — Progress Notes (Signed)
Subjective:    Patient ID: Cynthia Blanchard, female    DOB: [birth year], 42 y.o.   MRN: [id number]  No chief complaint on file.   HPI Patient is in today for evaluation of acute illness. She was ill with a bad head cold last week with significant head congestion, feeling fatigued and clammy. She notes headache, nausea but no vomiting. She used OTC meds including Afrin qhs last week and her congestion is improving. She is also noting some abdominal pain and bloating along with mid back pain and urinary frequency so she presented for care and was given a 3 day course of Bactrim and she is worried because she keeps having bloating and back pain. Denies CP/palp/SOB/fevers. Taking meds as prescribed  Past Medical History:  Diagnosis Date  . Abdominal pain 09/15/2014  . Anxiety and depression 06/01/2016  . Asthma    as a child  . Cervical cancer screening 09/03/2014  . Chicken pox as a child  . Diabetes mellitus without complication ([place]) 7371   gestational diabetes- first pregnancy- not in the last 2 pregnancies  . Dysuria 12/09/2013  . Fatigue 11/28/2015  . Grief reaction 11/28/2015  . Hyperlipidemia 12/09/2013  . Insomnia 12/09/2013  . Preventative health care 09/03/2014  . Spondylolisthesis 12/09/2013  . Sun-damaged skin 09/15/2014  . Throat irritation 12/09/2013    Past Surgical History:  Procedure Laterality Date  . DILATION AND CURETTAGE OF UTERUS  2012  . FOOT SURGERY  8-10   right- cyst- size of a golf ball  . tube removed  2012   right tube  . tube ressected  2009   right tube    Family History  Problem Relation Age of Onset  . Hypertension Mother   . Obesity Father   . Depression Father   . Cancer Maternal Grandmother        skin  . Other Maternal Grandmother        valve replacement  . Cancer Maternal Grandfather        colon  . Diabetes Paternal Grandmother        type 2  . Stroke Paternal Grandmother   . Cancer Paternal Grandfather        prostate  . Mental illness  Brother   . HIV Brother   . Drug abuse Brother   . Other Brother        Glomerular Nephritis    Social History   Socioeconomic History  . Marital status: Married    Spouse name: Not on file  . Number of children: Not on file  . Years of education: Not on file  . Highest education level: Not on file  Occupational History  . Not on file  Social Needs  . Financial resource strain: Not on file  . Food insecurity:    Worry: Not on file    Inability: Not on file  . Transportation needs:    Medical: Not on file    Non-medical: Not on file  Tobacco Use  . Smoking status: Never Smoker  . Smokeless tobacco: Never Used  Substance and Sexual Activity  . Alcohol use: Yes    Alcohol/week: 0.0 standard drinks    Comment: glass of wine every night  . Drug use: No  . 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 Physical activity:    Days per week: Not  on file    Minutes per session: Not on file  . Stress: Not on file  Relationships  . Social connections:    Talks on phone: Not on file    Gets together: Not on file    Attends religious service: Not on file    Active member of club or organization: Not on file    Attends meetings of clubs or organizations: Not on file    Relationship status: Not on file  . Intimate partner violence:    Fear of current or ex partner: Not on file    Emotionally abused: Not on file    Physically abused: Not on file    Forced sexual activity: Not on file  Other Topics Concern  . Not on file  Social History Narrative  . Not on file    Outpatient Medications Prior to Visit  Medication Sig Dispense Refill  . ALPRAZolam (XANAX) 0.5 MG tablet Take 1 tablet (0.5 mg total) by mouth 2 (two) times daily. 30 tablet 3  . Alum & Mag Hydroxide-Simeth (MAGIC MOUTHWASH) SOLN Take 5 mLs by mouth 4 (four) times daily as needed for mouth pain. 100 mL 0   . cyclobenzaprine (FLEXERIL) 10 MG tablet Take 1 tablet (10 mg total) by mouth 3 (three) times daily as needed for muscle spasms. 30 tablet 0  . FLUOROPLEX 1 % cream     . fluticasone (FLONASE) 50 MCG/ACT nasal spray Place 2 sprays into both nostrils daily. 16 g 6  . hyoscyamine (LEVSIN SL) 0.125 MG SL tablet Place 1 tablet (0.125 mg total) under the tongue every 6 (six) hours as needed. 30 tablet 1  . Probiotic Product (PROBIOTIC DAILY PO) Take by mouth.    . triamcinolone (KENALOG) 0.1 % paste Use as directed 1 application in the mouth or throat 2 (two) times daily. 5 g 1   No facility-administered medications prior to visit.     Allergies  Allergen Reactions  . Latex Hives  . Penicillins Hives    Review of Systems  Constitutional: Positive for malaise/fatigue. Negative for fever.  HENT: Positive for congestion.   Eyes: Negative for blurred vision.  Respiratory: Positive for cough and sputum production. Negative for shortness of breath.   Cardiovascular: Negative for chest pain, palpitations and leg swelling.  Gastrointestinal: Positive for abdominal pain. Negative for blood in stool and nausea.  Genitourinary: Positive for frequency. Negative for dysuria.  Musculoskeletal: Positive for back pain and myalgias. Negative for falls.  Skin: Negative for rash.  Neurological: Positive for headaches. Negative for dizziness and loss of consciousness.  Endo/Heme/Allergies: Negative for environmental allergies.  Psychiatric/Behavioral: Negative for depression. The patient is nervous/anxious.        Objective:    Physical Exam  Constitutional: She is oriented to person, place, and time. She appears well-developed and well-nourished. No distress.  HENT:  Head: Normocephalic and atraumatic.  Nose: Nose normal.  Eyes: Right eye exhibits no discharge. Left eye exhibits no discharge.  Neck: Normal range of motion. Neck supple.  Cardiovascular: Normal rate and regular rhythm.  No murmur  heard. Pulmonary/Chest: Effort normal and breath sounds normal.  Abdominal: Soft. Bowel sounds are normal. There is no tenderness.  Musculoskeletal: She exhibits no edema.  Neurological: She is alert and oriented to person, place, and time.  Skin: Skin is warm and dry.  Psychiatric: She has a normal mood and affect.  Nursing note and vitals reviewed.   BP 132/84 (BP Location: Left Arm, Patient Position: Sitting,  Cuff Size: Normal)   Pulse 69   Temp 98 F (36.7 C) (Oral)   Resp 18   Wt 129 lb 6.4 oz (58.7 kg)   SpO2 99%   BMI 20.31 kg/m  Wt Readings from Last 3 Encounters:  10/27/17 129 lb 6.4 oz (58.7 kg)  06/03/17 129 lb 9.6 oz (58.8 kg)  12/10/16 131 lb 4 oz (59.5 kg)     Lab Results  Component Value Date   WBC 5.5 10/27/2017   HGB 13.8 10/27/2017   HCT 41.1 10/27/2017   PLT 295.0 10/27/2017   GLUCOSE 85 10/27/2017   CHOL 162 12/10/2016   TRIG 37.0 12/10/2016   HDL 75.30 12/10/2016   LDLCALC 79 12/10/2016   ALT 18 10/27/2017   AST 22 10/27/2017   NA 138 10/27/2017   K 3.9 10/27/2017   CL 104 10/27/2017   CREATININE 0.68 10/27/2017   BUN 15 10/27/2017   CO2 28 10/27/2017   TSH 0.88 11/28/2015    Lab Results  Component Value Date   TSH 0.88 11/28/2015   Lab Results  Component Value Date   WBC 5.5 10/27/2017   HGB 13.8 10/27/2017   HCT 41.1 10/27/2017   MCV 93.3 10/27/2017   PLT 295.0 10/27/2017   Lab Results  Component Value Date   NA 138 10/27/2017   K 3.9 10/27/2017   CO2 28 10/27/2017   GLUCOSE 85 10/27/2017   BUN 15 10/27/2017   CREATININE 0.68 10/27/2017   BILITOT 0.7 10/27/2017   ALKPHOS 56 10/27/2017   AST 22 10/27/2017   ALT 18 10/27/2017   PROT 7.5 10/27/2017   ALBUMIN 4.7 10/27/2017   CALCIUM 9.4 10/27/2017   GFR 101.02 10/27/2017   Lab Results  Component Value Date   CHOL 162 12/10/2016   Lab Results  Component Value Date   HDL 75.30 12/10/2016   Lab Results  Component Value Date   LDLCALC 79 12/10/2016   Lab Results    Component Value Date   TRIG 37.0 12/10/2016   Lab Results  Component Value Date   CHOLHDL 2 12/10/2016   No results found for: HGBA1C     Assessment & Plan:   Problem List Items Addressed This Visit    Abdominal pain - Primary    With some bloating and low back pain with urinary frequency, was seen and given Bactrim for 3 days but still does not feel 100%. Labs are unremarkable she will report worsening symptoms and start a probiotic daily and increase hydration.       Relevant Orders   Comprehensive metabolic panel (Completed)   Sedimentation rate (Completed)   Urine cytology ancillary only   Anxiety    Very anxious about he symptoms. Discussed at length the fact that her symptoms have not been severe and have  Only been present one week. She is encouraged to let us know if symptoms persist or worsen so we can consider moving onto imaging to rule out any significant pathology. She agrees to avoid radiation for the time being and wait a week or two before proceed ing unless symptoms worsen      Vaginitis    Avoid heavy soaps, take a probiotic and check ancillary testing.       Relevant Orders   CBC with Differential/Platelet (Completed)   Comprehensive metabolic panel (Completed)   Sedimentation rate (Completed)   Urine cytology ancillary only   URI (upper respiratory infection)    Is improving this week over last week.  Encouraged increased rest and hydration, add probiotics, zinc such as Coldeze or Xicam. Treat fevers as needed, elderberry, vitamin c and mucinex.       Relevant Medications   metroNIDAZOLE (FLAGYL) 500 MG tablet    Other Visit Diagnoses    Flank pain       Relevant Orders   Urinalysis (Completed)      I am having Ronnette Hila start on metroNIDAZOLE. I am also having her maintain her Probiotic Product (PROBIOTIC DAILY PO), hyoscyamine, magic mouthwash, FLUOROPLEX, fluticasone, cyclobenzaprine, triamcinolone, and ALPRAZolam.  Meds ordered this  encounter  Medications  . metroNIDAZOLE (FLAGYL) 500 MG tablet    Sig: Take 1 tablet (500 mg total) by mouth 3 (three) times daily.    Dispense:  21 tablet    Refill:  0     Penni Homans, MD

## 2017-10-30 NOTE — Assessment & Plan Note (Signed)
With some bloating and low back pain with urinary frequency, was seen and given Bactrim for 3 days but still does not feel 100%. Labs are unremarkable she will report worsening symptoms and start a probiotic daily and increase hydration.

## 2017-10-30 NOTE — Assessment & Plan Note (Signed)
Is improving this week over last week. Encouraged increased rest and hydration, add probiotics, zinc such as Coldeze or Xicam. Treat fevers as needed, elderberry, vitamin c and mucinex.

## 2017-10-31 ENCOUNTER — Encounter: Payer: Self-pay | Admitting: Family Medicine

## 2017-10-31 LAB — URINE CYTOLOGY ANCILLARY ONLY
Bacterial vaginitis: NEGATIVE
Candida vaginitis: NEGATIVE

## 2017-12-03 IMAGING — US US AORTA
1 series · 14 of 14 positions shown · non-contrast
Comparison: None.

CLINICAL DATA: Palpable aorta.  Evaluate for aneurysm.

EXAM:
ULTRASOUND OF ABDOMINAL AORTA
TECHNIQUE: Ultrasound examination of the abdominal aorta was performed to
evaluate for abdominal aortic aneurysm.

[Series 1: us aorta · 0.18mm/px · 14 of 14 slices shown]
[im 1/14]
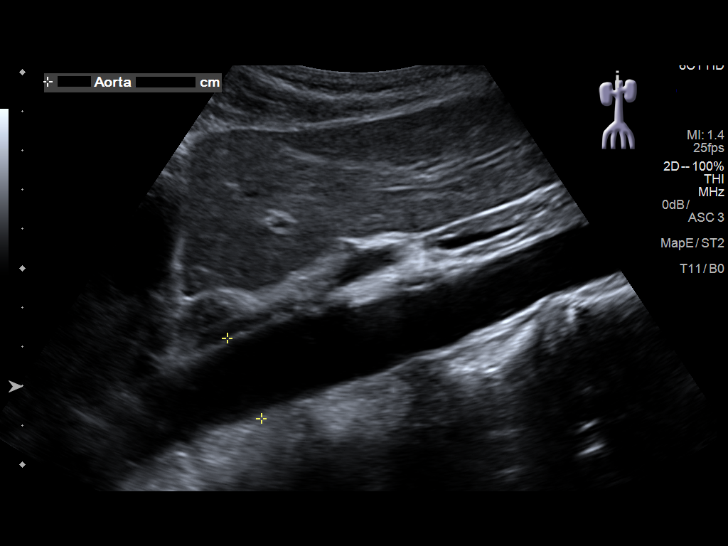
[im 2/14]
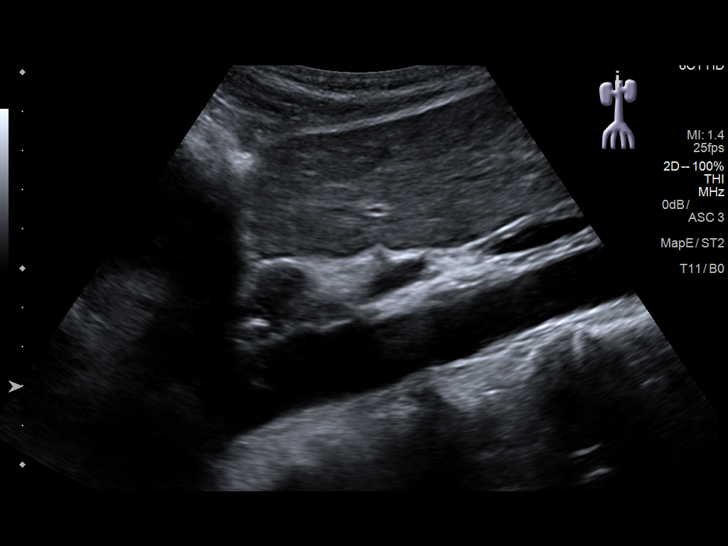
[im 3/14]
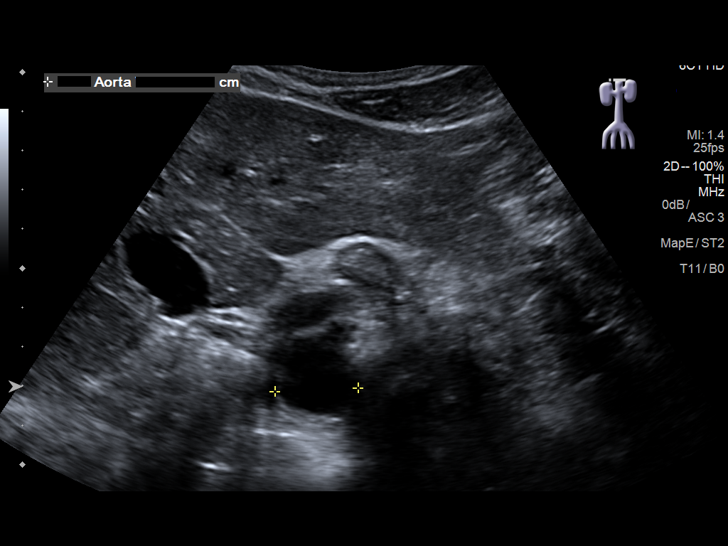
[im 4/14]
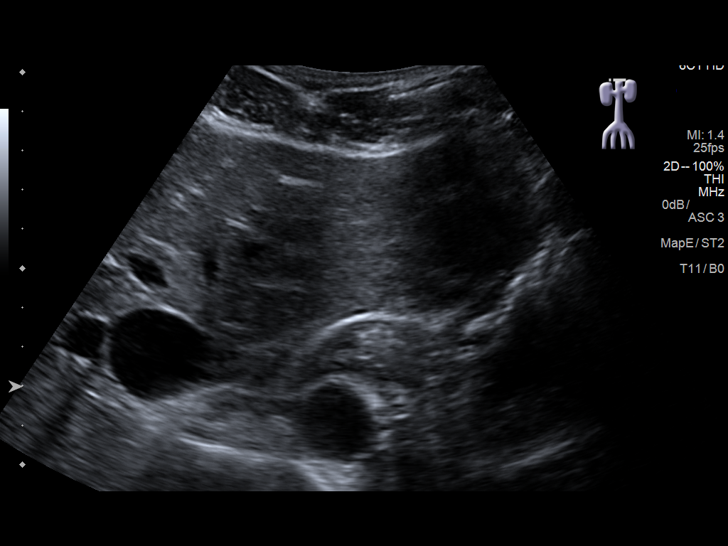
[im 5/14]
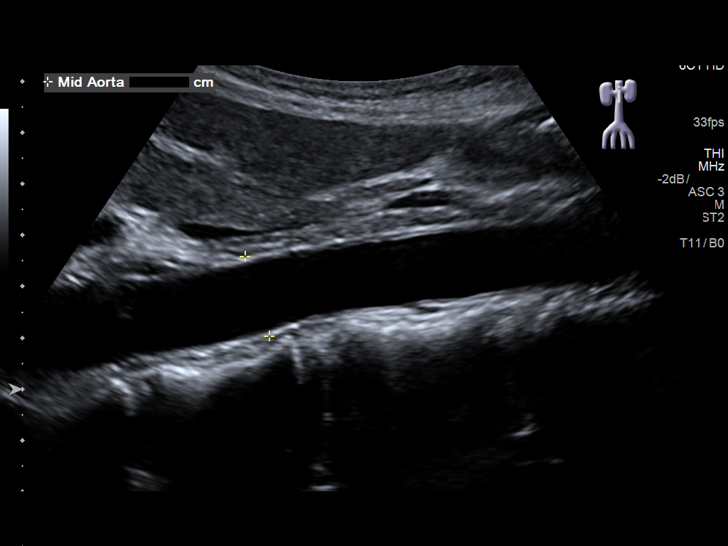
[im 6/14]
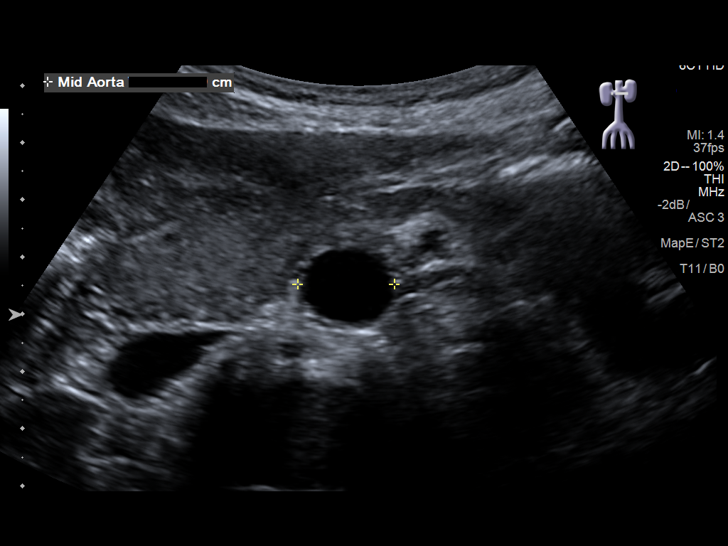
[im 7/14]
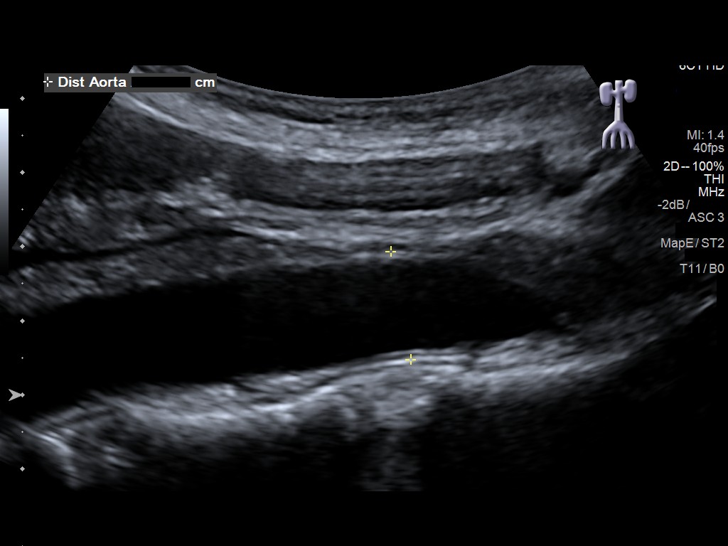
[im 8/14]
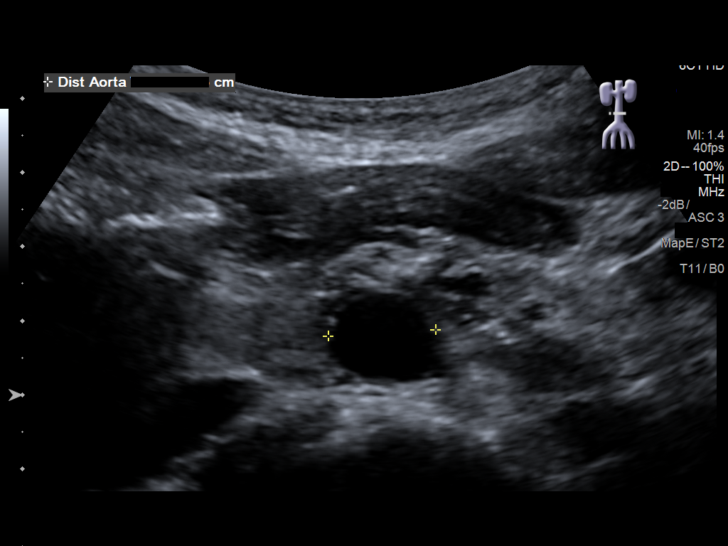
[im 9/14]
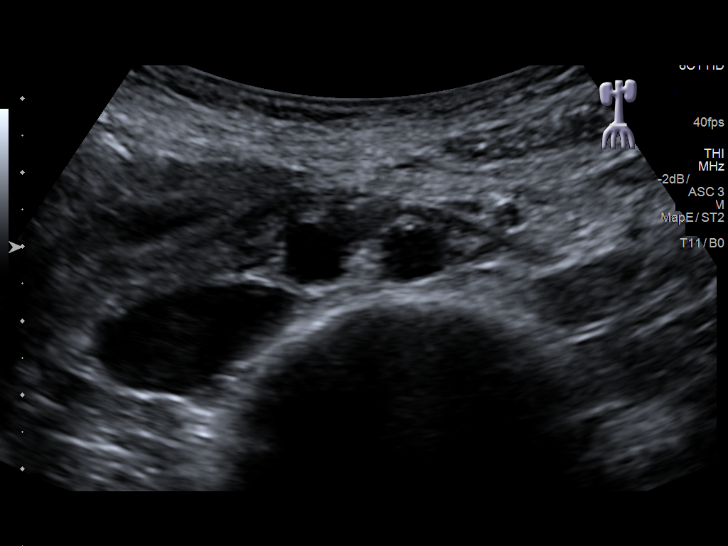
[im 10/14]
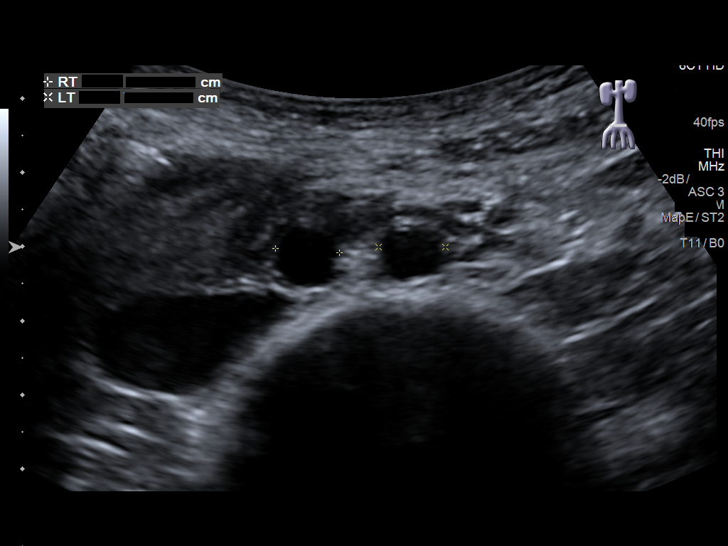
[im 11/14]
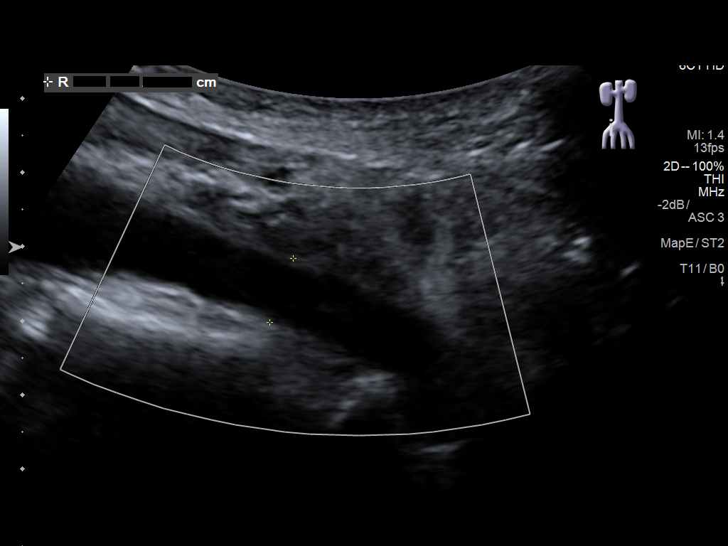
[im 12/14]
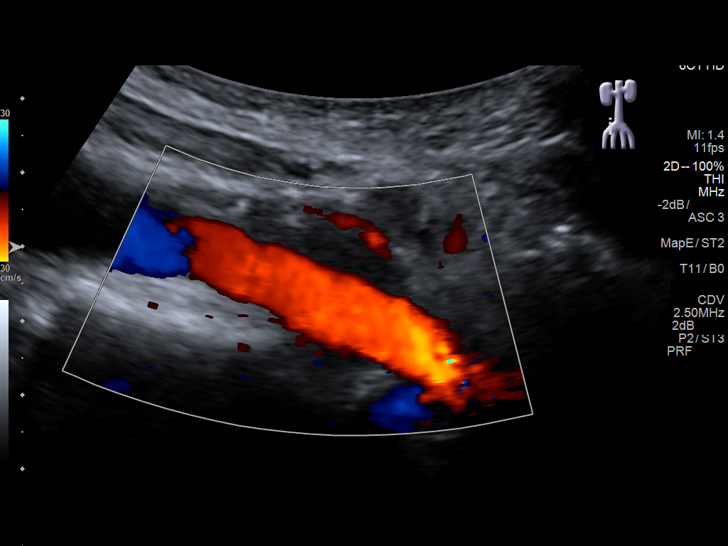
[im 13/14]
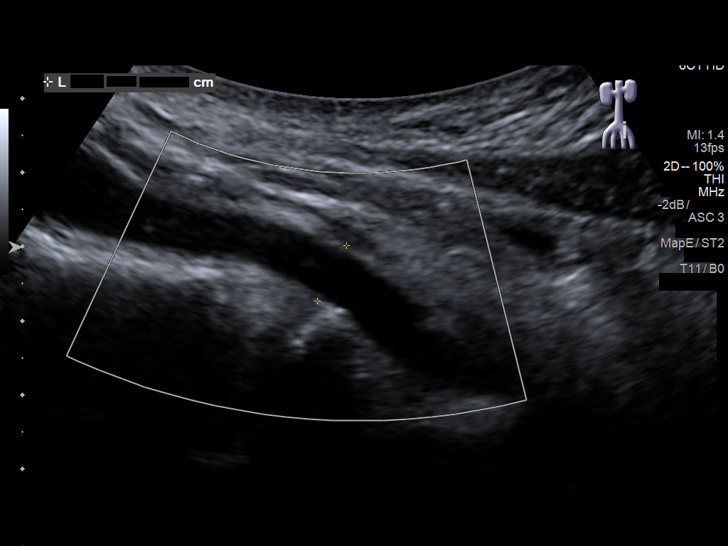
[im 14/14]
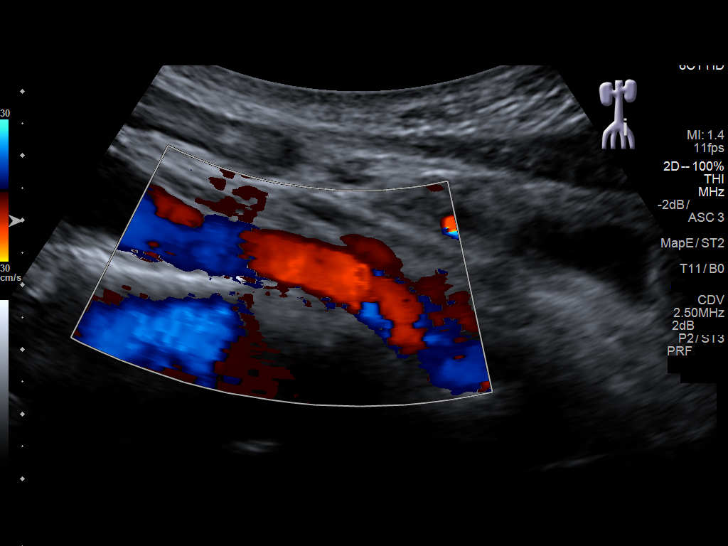

[14 of 14 positions shown; findings below may reference images not displayed]

FINDINGS: Abdominal aortic measurements as follows:

Proximal:  2.2 cm

Mid:  1.6 cm

Distal:  1.5 cm
IMPRESSION: Normal caliber abdominal aorta.

## 2017-12-16 ENCOUNTER — Telehealth: Payer: Self-pay

## 2017-12-16 ENCOUNTER — Encounter: Payer: Self-pay | Admitting: Family Medicine

## 2017-12-16 ENCOUNTER — Other Ambulatory Visit (HOSPITAL_COMMUNITY)
Admission: RE | Admit: 2017-12-16 | Discharge: 2017-12-16 | Disposition: A | Discharge status: Home / Self Care | Payer: 59 | Source: Ambulatory Visit | Attending: Family Medicine | Admitting: Family Medicine

## 2017-12-16 ENCOUNTER — Ambulatory Visit (INDEPENDENT_AMBULATORY_CARE_PROVIDER_SITE_OTHER): Payer: 59 | Admitting: Family Medicine

## 2017-12-16 VITALS — BP 94/62 | HR 65 | Temp 97.6°F | Resp 18 | Ht 67.0 in | Wt 131.4 lb

## 2017-12-16 DIAGNOSIS — Z79899 Other long term (current) drug therapy: Secondary | ICD-10-CM

## 2017-12-16 DIAGNOSIS — F419 Anxiety disorder, unspecified: Secondary | ICD-10-CM

## 2017-12-16 DIAGNOSIS — Z0000 Encounter for general adult medical examination without abnormal findings: Secondary | ICD-10-CM

## 2017-12-16 DIAGNOSIS — R109 Unspecified abdominal pain: Secondary | ICD-10-CM

## 2017-12-16 DIAGNOSIS — E7849 Other hyperlipidemia: Secondary | ICD-10-CM

## 2017-12-16 DIAGNOSIS — Z124 Encounter for screening for malignant neoplasm of cervix: Secondary | ICD-10-CM

## 2017-12-16 DIAGNOSIS — M549 Dorsalgia, unspecified: Secondary | ICD-10-CM | POA: Diagnosis not present

## 2017-12-16 DIAGNOSIS — L989 Disorder of the skin and subcutaneous tissue, unspecified: Secondary | ICD-10-CM | POA: Insufficient documentation

## 2017-12-16 LAB — TSH: TSH: 1.42 u[IU]/mL (ref 0.35–4.50)

## 2017-12-16 MED ORDER — CYCLOBENZAPRINE HCL 10 MG PO TABS: 10.0000 mg | ORAL_TABLET | Freq: Three times a day (TID) | ORAL | 0 refills | Status: DC | PRN

## 2017-12-16 MED ORDER — HYOSCYAMINE SULFATE 0.125 MG SL SUBL: 0.1250 mg | SUBLINGUAL_TABLET | Freq: Four times a day (QID) | SUBLINGUAL | 1 refills | Status: DC | PRN

## 2017-12-16 MED ORDER — ALPRAZOLAM 0.5 MG PO TABS: 0.5000 mg | ORAL_TABLET | Freq: Two times a day (BID) | ORAL | 3 refills | Status: DC

## 2017-12-16 NOTE — Progress Notes (Signed)
Subjective:    Patient ID: Cynthia Blanchard, female    DOB: 1975/10/11, 42 y.o.   MRN: [id number]  Chief Complaint  Patient presents with  . Annual Exam    HPI Patient is in today for annual preventative exam.  She is accompanied by her daughter.  She reports feeling much better than at her last visit.  She had been struggling significant back pain and recovering from a virus.  She notes the half dose of muscle relaxer at bedtime as needed for a few days was very helpful.  At this point she has returned to her baseline.  She is scheduled to see dermatology soon for a small spot on her right ear which is not resolved since she saw them last.  She is noting some small amount of swelling at the lateral aspect of her distal metatarsals bilaterally.  No pain, redness associated she follows with podiatry and will contact them to have this managed.  She does have intermittent trouble with what she describes as cervical pain and vaginitis but no symptoms at the current time.  She also notes some intermittent trouble with premenstrual dysphoric type syndrome.  She notes excessive irritability for the week leading up to her cycles recently.  Declines medications at this time but will consider in the future.  She does well with her activities of daily living.  She maintains a heart healthy diet and exercises regularly. Denies CP/palp/SOB/HA/congestion/fevers/GI or GU c/o. Taking meds as prescribed  Past Medical History:  Diagnosis Date  . Abdominal pain 09/15/2014  . Anxiety and depression 06/01/2016  . Asthma    as a child  . Cervical cancer screening 09/03/2014  . Chicken pox as a child  . Diabetes mellitus without complication ([place]) 4356   gestational diabetes- first pregnancy- not in the last 2 pregnancies  . Dysuria 12/09/2013  . Fatigue 11/28/2015  . Grief reaction 11/28/2015  . Hyperlipidemia 12/09/2013  . Insomnia 12/09/2013  . Preventative health care 09/03/2014  . Spondylolisthesis 12/09/2013  .  Sun-damaged skin 09/15/2014  . Throat irritation 12/09/2013    Past Surgical History:  Procedure Laterality Date  . DILATION AND CURETTAGE OF UTERUS  2012  . FOOT SURGERY  8-10   right- cyst- size of a golf ball  . tube removed  2012   right tube  . tube ressected  2009   right tube    Family History  Problem Relation Age of Onset  . Hypertension Mother   . Obesity Father   . Depression Father   . Cancer Maternal Grandmother        skin  . Other Maternal Grandmother        valve replacement  . Cancer Maternal Grandfather        colon  . Diabetes Paternal Grandmother        type 2  . Stroke Paternal Grandmother   . Cancer Paternal Grandfather        prostate  . Mental illness Brother   . HIV Brother   . Drug abuse Brother   . Other Brother        Glomerular Nephritis    Social History   Socioeconomic History  . Marital status: Married    Spouse name: Not on file  . Number of children: Not on file  . Years of education: Not on file  . Highest education level: Not on file  Occupational History  . Not on file  Social Needs  . Emergency planning/management officer  strain: Not on file  . Food insecurity:    Worry: Not on file    Inability: Not on file  . Transportation needs:    Medical: Not on file    Non-medical: Not on file  Tobacco Use  . Smoking status: Never Smoker  . Smokeless tobacco: Never Used  Substance and Sexual Activity  . Alcohol use: Yes    Alcohol/week: 0.0 standard drinks    Comment: glass of wine every night  . Drug use: No  . 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 Physical activity:    Days per week: Not on file    Minutes per session: Not on file  . Stress: Not on file  Relationships  . Social connections:    Talks on phone: Not on file    Gets together: Not on file    Attends religious service: Not on file    Active member of club  or organization: Not on file    Attends meetings of clubs or organizations: Not on file    Relationship status: Not on file  . Intimate partner violence:    Fear of current or ex partner: Not on file    Emotionally abused: Not on file    Physically abused: Not on file    Forced sexual activity: Not on file  Other Topics Concern  . Not on file  Social History Narrative  . Not on file    Outpatient Medications Prior to Visit  Medication Sig Dispense Refill  . Alum & Mag Hydroxide-Simeth (MAGIC MOUTHWASH) SOLN Take 5 mLs by mouth 4 (four) times daily as needed for mouth pain. 100 mL 0  . FLUOROPLEX 1 % cream     . fluticasone (FLONASE) 50 MCG/ACT nasal spray Place 2 sprays into both nostrils daily. 16 g 6  . metroNIDAZOLE (FLAGYL) 500 MG tablet Take 1 tablet (500 mg total) by mouth 3 (three) times daily. 21 tablet 0  . Probiotic Product (PROBIOTIC DAILY PO) Take by mouth.    . triamcinolone (KENALOG) 0.1 % paste Use as directed 1 application in the mouth or throat 2 (two) times daily. 5 g 1  . ALPRAZolam (XANAX) 0.5 MG tablet Take 1 tablet (0.5 mg total) by mouth 2 (two) times daily. 30 tablet 3  . cyclobenzaprine (FLEXERIL) 10 MG tablet Take 1 tablet (10 mg total) by mouth 3 (three) times daily as needed for muscle spasms. 30 tablet 0  . hyoscyamine (LEVSIN SL) 0.125 MG SL tablet Place 1 tablet (0.125 mg total) under the tongue every 6 (six) hours as needed. 30 tablet 1   No facility-administered medications prior to visit.     Allergies  Allergen Reactions  . Latex Hives  . Penicillins Hives    Review of Systems  Constitutional: Negative for chills, fever and malaise/fatigue.  HENT: Negative for congestion and hearing loss.   Eyes: Negative for discharge.  Respiratory: Negative for cough, sputum production and shortness of breath.   Cardiovascular: Negative for chest pain, palpitations and leg swelling.  Gastrointestinal: Negative for abdominal pain, blood in stool,  constipation, diarrhea, heartburn, nausea and vomiting.  Genitourinary: Negative for dysuria, frequency, hematuria and urgency.  Musculoskeletal: Positive for back pain. Negative for falls and myalgias.  Skin: Negative for rash.  Neurological: Negative for dizziness, sensory change, loss of consciousness, weakness and headaches.  Endo/Heme/Allergies: Negative  for environmental allergies. Does not bruise/bleed easily.  Psychiatric/Behavioral: Negative for depression and suicidal ideas. The patient is not nervous/anxious and does not have insomnia.        Objective:    Physical Exam  Constitutional: She is oriented to person, place, and time. She appears well-developed and well-nourished. No distress.  HENT:  Head: Normocephalic and atraumatic.  Eyes: Conjunctivae are normal.  Neck: Neck supple. No thyromegaly present.  Cardiovascular: Normal rate, regular rhythm and normal heart sounds.  No murmur heard. Pulmonary/Chest: Effort normal and breath sounds normal. No respiratory distress.  Abdominal: Soft. Bowel sounds are normal. She exhibits no distension and no mass. There is no tenderness.  Genitourinary: Vagina normal and uterus normal. No vaginal discharge found.  Musculoskeletal: She exhibits no edema.  Lymphadenopathy:    She has no cervical adenopathy.  Neurological: She is alert and oriented to person, place, and time.  Skin: Skin is warm and dry.  Psychiatric: She has a normal mood and affect. Her behavior is normal.    BP 94/62 (BP Location: Left Arm, Patient Position: Sitting, Cuff Size: Normal)   Pulse 65   Temp 97.6 F (36.4 C) (Oral)   Resp 18   Ht 5' 7"  (1.702 m)   Wt 131 lb 6.4 oz (59.6 kg)   SpO2 97%   BMI 20.58 kg/m  Wt Readings from Last 3 Encounters:  12/16/17 131 lb 6.4 oz (59.6 kg)  10/27/17 129 lb 6.4 oz (58.7 kg)  06/03/17 129 lb 9.6 oz (58.8 kg)     Lab Results  Component Value Date   WBC 5.5 10/27/2017   HGB 13.8 10/27/2017   HCT 41.1  10/27/2017   PLT 295.0 10/27/2017   GLUCOSE 85 10/27/2017   CHOL 162 12/10/2016   TRIG 37.0 12/10/2016   HDL 75.30 12/10/2016   LDLCALC 79 12/10/2016   ALT 18 10/27/2017   AST 22 10/27/2017   NA 138 10/27/2017   K 3.9 10/27/2017   CL 104 10/27/2017   CREATININE 0.68 10/27/2017   BUN 15 10/27/2017   CO2 28 10/27/2017   TSH 0.88 11/28/2015    Lab Results  Component Value Date   TSH 0.88 11/28/2015   Lab Results  Component Value Date   WBC 5.5 10/27/2017   HGB 13.8 10/27/2017   HCT 41.1 10/27/2017   MCV 93.3 10/27/2017   PLT 295.0 10/27/2017   Lab Results  Component Value Date   NA 138 10/27/2017   K 3.9 10/27/2017   CO2 28 10/27/2017   GLUCOSE 85 10/27/2017   BUN 15 10/27/2017   CREATININE 0.68 10/27/2017   BILITOT 0.7 10/27/2017   ALKPHOS 56 10/27/2017   AST 22 10/27/2017   ALT 18 10/27/2017   PROT 7.5 10/27/2017   ALBUMIN 4.7 10/27/2017   CALCIUM 9.4 10/27/2017   GFR 101.02 10/27/2017   Lab Results  Component Value Date   CHOL 162 12/10/2016   Lab Results  Component Value Date   HDL 75.30 12/10/2016   Lab Results  Component Value Date   LDLCALC 79 12/10/2016   Lab Results  Component Value Date   TRIG 37.0 12/10/2016   Lab Results  Component Value Date   CHOLHDL 2 12/10/2016   No results found for: HGBA1C     Assessment & Plan:   Problem List Items Addressed This Visit    Hyperlipidemia    Normal for past couple of years.       Cervical cancer screening  Pap today, no concerns on exam. Notes some mood swings premenstrual consider SSRI      Relevant Orders   Cytology - PAP( [place])   Preventative health care    Patient encouraged to maintain heart healthy diet, regular exercise, adequate sleep. Consider daily probiotics. Take medications as prescribed. Given and reviewed copy of ACP documents from [employer] and encouraged to complete and return.      Relevant Orders   TSH   Abdominal pain    hysocyamine helpful  prn      Back pain    Encouraged moist heat and gentle stretching as tolerated. May try NSAIDs and prescription meds as directed and report if symptoms worsen or seek immediate care      Relevant Medications   cyclobenzaprine (FLEXERIL) 10 MG tablet   Anxiety    Doing well uses Alprazolam sparingly with good results. UDS updated today      Relevant Medications   ALPRAZolam (XANAX) 0.5 MG tablet   Foot lesion    Bilateral swelling, slight at lateral aspect of distal metatarsals referred to podiatry whom she already sees. She will call       Other Visit Diagnoses    High risk medication use    -  Primary   Relevant Orders   Pain Mgmt, Profile 8 w/Conf, U      I am having Ronnette Hila maintain her Probiotic Product (PROBIOTIC DAILY PO), magic mouthwash, FLUOROPLEX, fluticasone, triamcinolone, metroNIDAZOLE, ALPRAZolam, cyclobenzaprine, and hyoscyamine.  Meds ordered this encounter  Medications  . ALPRAZolam (XANAX) 0.5 MG tablet    Sig: Take 1 tablet (0.5 mg total) by mouth 2 (two) times daily.    Dispense:  30 tablet    Refill:  3  . cyclobenzaprine (FLEXERIL) 10 MG tablet    Sig: Take 1 tablet (10 mg total) by mouth 3 (three) times daily as needed for muscle spasms.    Dispense:  30 tablet    Refill:  0  . hyoscyamine (LEVSIN SL) 0.125 MG SL tablet    Sig: Place 1 tablet (0.125 mg total) under the tongue every 6 (six) hours as needed.    Dispense:  30 tablet    Refill:  1     Penni Homans, MD

## 2017-12-16 NOTE — Assessment & Plan Note (Signed)
Normal for past couple of years.

## 2017-12-16 NOTE — Telephone Encounter (Signed)
Received call from [facility]- pap they received today did not have name on container- they are sending back to office.

## 2017-12-16 NOTE — Patient Instructions (Signed)
Preventive Care 18-39 Years, Female Preventive care refers to lifestyle choices and visits with your health care provider that can promote health and wellness. What does preventive care include?  A yearly physical exam. This is also called an annual well check.  Dental exams once or twice a year.  Routine eye exams. Ask your health care provider how often you should have your eyes checked.  Personal lifestyle choices, including: ? Daily care of your teeth and gums. ? Regular physical activity. ? Eating a healthy diet. ? Avoiding tobacco and drug use. ? Limiting alcohol use. ? Practicing safe sex. ? Taking vitamin and mineral supplements as recommended by your health care provider. What happens during an annual well check? The services and screenings done by your health care provider during your annual well check will depend on your age, overall health, lifestyle risk factors, and family history of disease. Counseling Your health care provider may ask you questions about your:  Alcohol use.  Tobacco use.  Drug use.  Emotional well-being.  Home and relationship well-being.  Sexual activity.  Eating habits.  Work and work Statistician.  Method of birth control.  Menstrual cycle.  Pregnancy history.  Screening You may have the following tests or measurements:  Height, weight, and BMI.  Diabetes screening. This is done by checking your blood sugar (glucose) after you have not eaten for a while (fasting).  Blood pressure.  Lipid and cholesterol levels. These may be checked every 5 years starting at age 66.  Skin check.  Hepatitis C blood test.  Hepatitis B blood test.  Sexually transmitted disease (STD) testing.  BRCA-related cancer screening. This may be done if you have a family history of breast, ovarian, tubal, or peritoneal cancers.  Pelvic exam and Pap test. This may be done every 3 years starting at age 40. Starting at age 59, this may be done every 5  years if you have a Pap test in combination with an HPV test.  Discuss your test results, treatment options, and if necessary, the need for more tests with your health care provider. Vaccines Your health care provider may recommend certain vaccines, such as:  Influenza vaccine. This is recommended every year.  Tetanus, diphtheria, and acellular pertussis (Tdap, Td) vaccine. You may need a Td booster every 10 years.  Varicella vaccine. You may need this if you have not been vaccinated.  HPV vaccine. If you are 69 or younger, you may need three doses over 6 months.  Measles, mumps, and rubella (MMR) vaccine. You may need at least one dose of MMR. You may also need a second dose.  Pneumococcal 13-valent conjugate (PCV13) vaccine. You may need this if you have certain conditions and were not previously vaccinated.  Pneumococcal polysaccharide (PPSV23) vaccine. You may need one or two doses if you smoke cigarettes or if you have certain conditions.  Meningococcal vaccine. One dose is recommended if you are age 27-21 years and a first-year college student living in a residence hall, or if you have one of several medical conditions. You may also need additional booster doses.  Hepatitis A vaccine. You may need this if you have certain conditions or if you travel or work in places where you may be exposed to hepatitis A.  Hepatitis B vaccine. You may need this if you have certain conditions or if you travel or work in places where you may be exposed to hepatitis B.  Haemophilus influenzae type b (Hib) vaccine. You may need this if  you have certain risk factors.  Talk to your health care provider about which screenings and vaccines you need and how often you need them. This information is not intended to replace advice given to you by your health care provider. Make sure you discuss any questions you have with your health care provider. Document Released: 03/16/2001 Document Revised: 10/08/2015  Document Reviewed: 11/19/2014 Elsevier Interactive Patient Education  [employer].

## 2017-12-16 NOTE — Assessment & Plan Note (Signed)
Pap today, no concerns on exam. Notes some mood swings premenstrual consider SSRI

## 2017-12-16 NOTE — Assessment & Plan Note (Signed)
hysocyamine helpful prn

## 2017-12-16 NOTE — Assessment & Plan Note (Addendum)
Patient encouraged to maintain heart healthy diet, regular exercise, adequate sleep. Consider daily probiotics. Take medications as prescribed. Given and reviewed copy of ACP documents from [place] Secretary of State and encouraged to complete and return 

## 2017-12-16 NOTE — Assessment & Plan Note (Signed)
Encouraged moist heat and gentle stretching as tolerated. May try NSAIDs and prescription meds as directed and report if symptoms worsen or seek immediate care 

## 2017-12-16 NOTE — Assessment & Plan Note (Signed)
Doing well uses Alprazolam sparingly with good results. UDS updated today

## 2017-12-16 NOTE — Assessment & Plan Note (Signed)
Bilateral swelling, slight at lateral aspect of distal metatarsals referred to podiatry whom she already sees. She will call

## 2017-12-17 LAB — PAIN MGMT, PROFILE 8 W/CONF, U
6 ACETYLMORPHINE: NEGATIVE ng/mL (ref <10)
ALCOHOL METABOLITES: NEGATIVE ng/mL (ref <500)
Amphetamines: NEGATIVE ng/mL (ref <500)
Benzodiazepines: NEGATIVE ng/mL (ref <100)
Buprenorphine, Urine: NEGATIVE ng/mL (ref <5)
COCAINE METABOLITE: NEGATIVE ng/mL (ref <150)
CREATININE: 26.6 mg/dL
MARIJUANA METABOLITE: NEGATIVE ng/mL (ref <20)
MDMA: NEGATIVE ng/mL (ref <500)
OPIATES: NEGATIVE ng/mL (ref <100)
Oxidant: NEGATIVE ug/mL (ref <200)
Oxycodone: NEGATIVE ng/mL (ref <100)
PH: 6.85 (ref 4.5–9.0)

## 2017-12-19 NOTE — Telephone Encounter (Signed)
Pap was labled and sent back

## 2017-12-20 LAB — CYTOLOGY - PAP: DIAGNOSIS: NEGATIVE

## 2018-05-23 ENCOUNTER — Encounter: Payer: Self-pay | Admitting: Family Medicine

## 2018-06-06 ENCOUNTER — Ambulatory Visit (INDEPENDENT_AMBULATORY_CARE_PROVIDER_SITE_OTHER): Payer: 59 | Admitting: Family Medicine

## 2018-06-06 ENCOUNTER — Other Ambulatory Visit: Payer: Self-pay

## 2018-06-06 DIAGNOSIS — F419 Anxiety disorder, unspecified: Secondary | ICD-10-CM | POA: Diagnosis not present

## 2018-06-06 DIAGNOSIS — H60391 Other infective otitis externa, right ear: Secondary | ICD-10-CM | POA: Diagnosis not present

## 2018-06-06 DIAGNOSIS — F4321 Adjustment disorder with depressed mood: Secondary | ICD-10-CM | POA: Diagnosis not present

## 2018-06-06 DIAGNOSIS — H6091 Unspecified otitis externa, right ear: Secondary | ICD-10-CM | POA: Insufficient documentation

## 2018-06-06 MED ORDER — ESCITALOPRAM OXALATE 10 MG PO TABS: 10.0000 mg | ORAL_TABLET | Freq: Every day | ORAL | 3 refills | Status: DC

## 2018-06-06 MED ORDER — SULFAMETHOXAZOLE-TRIMETHOPRIM 800-160 MG PO TABS: 1.0000 | ORAL_TABLET | Freq: Two times a day (BID) | ORAL | 0 refills | Status: DC

## 2018-06-06 MED ORDER — NEOMYCIN-POLYMYXIN-HC 3.5-10000-1 OT SOLN: 3.0000 [drp] | Freq: Three times a day (TID) | OTIC | 0 refills | Status: DC

## 2018-06-06 MED ORDER — ALPRAZOLAM 0.5 MG PO TABS: 0.5000 mg | ORAL_TABLET | Freq: Two times a day (BID) | ORAL | 3 refills | Status: DC

## 2018-06-06 NOTE — Progress Notes (Signed)
Virtual Visit via Video Note  I connected with Cynthia Blanchard on 06/06/18 at  3:20 PM EDT by a video enabled telemedicine application and verified that I am speaking with the correct person using two identifiers.  Location: Patient: home Provider: home   I discussed the limitations of evaluation and management by telemedicine and the availability of in person appointments. The patient expressed understanding and agreed to proceed. Magdalene Molly, CMA was able to get the patient on a video visit     Subjective:    Patient ID: Cynthia Blanchard, female    DOB: 23-Jun-1975, 43 y.o.   MRN: [id number]  No chief complaint on file.   HPI Patient is in today for evaluation of right ear pain. She has had several right sided ear pain a couple of times over the past few months. She has used some cortisporin she had at home which helped. No fevers, chills, discharge etc. No hearing loss. She is really struggling with sadness over the death of her brother in 03-17-2022. She is noting anhedonia and easy tearfulness. 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 Denies CP/palp/SOB/HA/congestion/fevers/GI or GU c/o. Taking meds as prescribed  Past Medical History:  Diagnosis Date  . Abdominal pain 09/15/2014  . Anxiety and depression 06/01/2016  . Asthma    as a child  . Cervical cancer screening 09/03/2014  . Chicken pox as a child  . Diabetes mellitus without complication ([place]) 2774   gestational diabetes- first pregnancy- not in the last 2 pregnancies  . Dysuria 12/09/2013  . Fatigue 11/28/2015  . Grief reaction 11/28/2015  . Hyperlipidemia 12/09/2013  . Insomnia 12/09/2013  . Preventative health care 09/03/2014  . Spondylolisthesis 12/09/2013  . Sun-damaged skin 09/15/2014  . Throat irritation 12/09/2013    Past Surgical History:  Procedure Laterality Date  . DILATION AND CURETTAGE OF UTERUS  2012  . FOOT SURGERY  8-10   right- cyst- size of a golf ball  .  tube removed  2012   right tube  . tube ressected  2009   right tube    Family History  Problem Relation Age of Onset  . Hypertension Mother   . Obesity Father   . Depression Father   . Cancer Maternal Grandmother        skin  . Other Maternal Grandmother        valve replacement  . Cancer Maternal Grandfather        colon  . Diabetes Paternal Grandmother        type 2  . Stroke Paternal Grandmother   . Cancer Paternal Grandfather        prostate  . Mental illness Brother   . HIV Brother   . Drug abuse Brother   . Other Brother        Glomerular Nephritis    Social History   Socioeconomic History  . Marital status: Married    Spouse name: Not on file  . Number of children: Not on file  . Years of education: Not on file  . Highest education level: Not on file  Occupational History  . Not on file  Social Needs  . Financial resource strain: Not on file  . Food insecurity:    Worry: Not on file    Inability: Not on file  . Transportation needs:    Medical: Not on file    Non-medical: Not on file  Tobacco Use  . Smoking status: Never  Smoker  . Smokeless tobacco: Never Used  Substance and Sexual Activity  . Alcohol use: Yes    Alcohol/week: 0.0 standard drinks    Comment: glass of wine every night  . Drug use: No  . 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 Physical activity:    Days per week: Not on file    Minutes per session: Not on file  . Stress: Not on file  Relationships  . Social connections:    Talks on phone: Not on file    Gets together: Not on file    Attends religious service: Not on file    Active member of club or organization: Not on file    Attends meetings of clubs or organizations: Not on file    Relationship status: Not on file  . Intimate partner violence:    Fear of current or ex partner: Not on file    Emotionally  abused: Not on file    Physically abused: Not on file    Forced sexual activity: Not on file  Other Topics Concern  . Not on file  Social History Narrative  . Not on file    Outpatient Medications Prior to Visit  Medication Sig Dispense Refill  . Alum & Mag Hydroxide-Simeth (MAGIC MOUTHWASH) SOLN Take 5 mLs by mouth 4 (four) times daily as needed for mouth pain. 100 mL 0  . cyclobenzaprine (FLEXERIL) 10 MG tablet Take 1 tablet (10 mg total) by mouth 3 (three) times daily as needed for muscle spasms. 30 tablet 0  . FLUOROPLEX 1 % cream     . fluticasone (FLONASE) 50 MCG/ACT nasal spray Place 2 sprays into both nostrils daily. 16 g 6  . hyoscyamine (LEVSIN SL) 0.125 MG SL tablet Place 1 tablet (0.125 mg total) under the tongue every 6 (six) hours as needed. 30 tablet 1  . metroNIDAZOLE (FLAGYL) 500 MG tablet Take 1 tablet (500 mg total) by mouth 3 (three) times daily. 21 tablet 0  . Probiotic Product (PROBIOTIC DAILY PO) Take by mouth.    . triamcinolone (KENALOG) 0.1 % paste Use as directed 1 application in the mouth or throat 2 (two) times daily. 5 g 1  . ALPRAZolam (XANAX) 0.5 MG tablet Take 1 tablet (0.5 mg total) by mouth 2 (two) times daily. 30 tablet 3   No facility-administered medications prior to visit.     Allergies  Allergen Reactions  . Latex Hives  . Penicillins Hives    Review of Systems  Constitutional: Negative for fever and malaise/fatigue.  HENT: Positive for ear pain. Negative for congestion, ear discharge, hearing loss and tinnitus.   Eyes: Negative for blurred vision.  Respiratory: Negative for shortness of breath.   Cardiovascular: Negative for chest pain, palpitations and leg swelling.  Gastrointestinal: Negative for abdominal pain, blood in stool and nausea.  Genitourinary: Negative for dysuria and frequency.  Musculoskeletal: Negative for falls.  Skin: Negative for rash.  Neurological: Negative for dizziness, loss of consciousness and headaches.   Endo/Heme/Allergies: Negative for environmental allergies.  Psychiatric/Behavioral: Positive for depression. The patient is nervous/anxious.        Objective:    Physical Exam  There were no vitals taken for this visit. Wt Readings from Last 3 Encounters:  12/16/17 131 lb 6.4 oz (59.6 kg)  10/27/17 129 lb 6.4 oz (58.7 kg)  06/03/17 129 lb  9.6 oz (58.8 kg)    Diabetic Foot Exam - Simple   No data filed     Lab Results  Component Value Date   WBC 5.5 10/27/2017   HGB 13.8 10/27/2017   HCT 41.1 10/27/2017   PLT 295.0 10/27/2017   GLUCOSE 85 10/27/2017   CHOL 162 12/10/2016   TRIG 37.0 12/10/2016   HDL 75.30 12/10/2016   LDLCALC 79 12/10/2016   ALT 18 10/27/2017   AST 22 10/27/2017   NA 138 10/27/2017   K 3.9 10/27/2017   CL 104 10/27/2017   CREATININE 0.68 10/27/2017   BUN 15 10/27/2017   CO2 28 10/27/2017   TSH 1.42 12/16/2017    Lab Results  Component Value Date   TSH 1.42 12/16/2017   Lab Results  Component Value Date   WBC 5.5 10/27/2017   HGB 13.8 10/27/2017   HCT 41.1 10/27/2017   MCV 93.3 10/27/2017   PLT 295.0 10/27/2017   Lab Results  Component Value Date   NA 138 10/27/2017   K 3.9 10/27/2017   CO2 28 10/27/2017   GLUCOSE 85 10/27/2017   BUN 15 10/27/2017   CREATININE 0.68 10/27/2017   BILITOT 0.7 10/27/2017   ALKPHOS 56 10/27/2017   AST 22 10/27/2017   ALT 18 10/27/2017   PROT 7.5 10/27/2017   ALBUMIN 4.7 10/27/2017   CALCIUM 9.4 10/27/2017   GFR 101.02 10/27/2017   Lab Results  Component Value Date   CHOL 162 12/10/2016   Lab Results  Component Value Date   HDL 75.30 12/10/2016   Lab Results  Component Value Date   LDLCALC 79 12/10/2016   Lab Results  Component Value Date   TRIG 37.0 12/10/2016   Lab Results  Component Value Date   CHOLHDL 2 12/10/2016   No results found for: HGBA1C     Assessment & Plan:   Problem List Items Addressed This Visit    Grief reaction    Is feeling overwhelmed by the loss of her  brother at age 18 in January. She is considering counseling with [facility] and she will call them if she decides to proceed. She is started on Lexapro 10 mg po daily. Refilled Alprazolam prn she will call if she has any concerns. Counseled regarding treatment options for 20 minutes of a 25 minute visit.       Anxiety    Has used low doses of Alprazolam prn with good results refill allowed.       Relevant Medications   ALPRAZolam (XANAX) 0.5 MG tablet   escitalopram (LEXAPRO) 10 MG tablet   Otitis externa of right ear    Has had several episodes over the past couple of months  Given rx for Cortisporin Otic to use 3-4 drops 4 x a day and if inadeqaute response then can use Bactrim DS bid x 7 days         I am having Ronnette Hila start on neomycin-polymyxin-hydrocortisone, sulfamethoxazole-trimethoprim, and escitalopram. I am also having her maintain her Probiotic Product (PROBIOTIC DAILY PO), magic mouthwash, Fluoroplex, fluticasone, triamcinolone, metroNIDAZOLE, cyclobenzaprine, hyoscyamine, and ALPRAZolam.  Meds ordered this encounter  Medications  . neomycin-polymyxin-hydrocortisone (CORTISPORIN) OTIC solution    Sig: Place 3 drops into the right ear 3 (three) times daily.    Dispense:  10 mL    Refill:  0  . sulfamethoxazole-trimethoprim (BACTRIM DS) 800-160 MG tablet    Sig: Take 1 tablet by mouth 2 (two) times daily.    Dispense:  14 tablet    Refill:  0  . ALPRAZolam (XANAX) 0.5 MG tablet    Sig: Take 1 tablet (0.5 mg total) by mouth 2 (two) times daily.    Dispense:  30 tablet    Refill:  3  . escitalopram (LEXAPRO) 10 MG tablet    Sig: Take 1 tablet (10 mg total) by mouth daily.    Dispense:  30 tablet    Refill:  3    I discussed the assessment and treatment plan with the patient. The patient was provided an opportunity to ask questions and all were answered. The patient agreed with the plan and demonstrated an understanding of the instructions.   The patient was advised  to call back or seek an in-person evaluation if the symptoms worsen or if the condition fails to improve as anticipated.  I provided 25 minutes of non-face-to-face time during this encounter.   Penni Homans, MD .

## 2018-06-06 NOTE — Assessment & Plan Note (Signed)
Has had several episodes over the past couple of months  Given rx for Cortisporin Otic to use 3-4 drops 4 x a day and if inadeqaute response then can use Bactrim DS bid x 7 days

## 2018-06-06 NOTE — Assessment & Plan Note (Addendum)
Is feeling overwhelmed by the loss of her brother at age 43 in January. She is considering counseling with [facility] and she will call them if she decides to proceed. She is started on Lexapro 10 mg po daily. Refilled Alprazolam prn she will call if she has any concerns. Counseled regarding treatment options for 20 minutes of a 25 minute visit.

## 2018-06-06 NOTE — Assessment & Plan Note (Signed)
Has used low doses of Alprazolam prn with good results refill allowed.

## 2018-06-16 ENCOUNTER — Ambulatory Visit: Payer: 59 | Admitting: Family Medicine

## 2019-02-12 ENCOUNTER — Encounter: Payer: Self-pay | Admitting: Family Medicine

## 2019-02-16 MED ORDER — ALPRAZOLAM 0.5 MG PO TABS: 0.5000 mg | ORAL_TABLET | Freq: Two times a day (BID) | ORAL | 3 refills | Status: DC

## 2019-02-16 NOTE — Telephone Encounter (Signed)
Cynthia Blanchard Patient needs a visit with pcp for medication follow up, can be VV

## 2019-02-16 NOTE — Telephone Encounter (Signed)
Requesting:xanax Contract:yes UDS:low risk  Last OV:06/06/18 Next OV:n/a calling to schedule her  Last Refill:06/06/18 #[id number] Database:   Please advise

## 2019-03-19 ENCOUNTER — Ambulatory Visit (INDEPENDENT_AMBULATORY_CARE_PROVIDER_SITE_OTHER): Payer: Managed Care, Other (non HMO) | Admitting: Family Medicine

## 2019-03-19 ENCOUNTER — Other Ambulatory Visit: Payer: Self-pay

## 2019-03-19 DIAGNOSIS — R109 Unspecified abdominal pain: Secondary | ICD-10-CM

## 2019-03-19 DIAGNOSIS — G4700 Insomnia, unspecified: Secondary | ICD-10-CM

## 2019-03-19 DIAGNOSIS — F4321 Adjustment disorder with depressed mood: Secondary | ICD-10-CM

## 2019-03-19 DIAGNOSIS — E7849 Other hyperlipidemia: Secondary | ICD-10-CM | POA: Diagnosis not present

## 2019-03-19 MED ORDER — ALPRAZOLAM 0.5 MG PO TABS: 0.5000 mg | ORAL_TABLET | Freq: Two times a day (BID) | ORAL | 3 refills | Status: DC

## 2019-03-19 MED ORDER — HYOSCYAMINE SULFATE 0.125 MG SL SUBL: 0.1250 mg | SUBLINGUAL_TABLET | Freq: Four times a day (QID) | SUBLINGUAL | 1 refills | Status: AC | PRN

## 2019-03-19 NOTE — Patient Instructions (Signed)
NOW company supplements at [email or web address], [employer]   Digestive enzymes/pancreatic enzyme  [employer] Blood Pressure cuff, upper arm, want BP 100-140/60-90 Pulse oximeter, want oxygen in 90s  Weekly vitals  Take Multivitamin with minerals, selenium Vitamin D 1000-2000 IU daily Probiotic with lactobacillus and bifidophilus Asprin EC 81 mg daily  Melatonin 2-5 mg at bedtime  [email or web address].[email or web address]/, [place] the word COVID to [id number], [phone] [employer], [phone]

## 2019-03-19 NOTE — Assessment & Plan Note (Signed)
Avoid offending foods, start probiotics. Do not eat large meals in late evening and consider raising head of bed. Occurs less frequently but does get some relief from Hyoscyamine so patient can try to use it prn

## 2019-03-19 NOTE — Assessment & Plan Note (Signed)
Encouraged heart healthy diet, increase exercise, avoid trans fats, consider a krill oil cap daily 

## 2019-03-19 NOTE — Assessment & Plan Note (Signed)
Encouraged good sleep hygiene such as dark, quiet room. No blue/green glowing lights such as computer screens in bedroom. No alcohol or stimulants in evening. Cut down on caffeine as able. Regular exercise is helpful but not just prior to bed time. Try Melatonin and she has been using Alprazolam to help as well with good results is allowed a refill

## 2019-03-19 NOTE — Assessment & Plan Note (Signed)
She struggled with the loss of her brother last year but she did not like the way the Lexapro made her feel, spacy for instance so she stopped it and she now feels much better. No changes today

## 2019-03-19 NOTE — Progress Notes (Signed)
Virtual Visit via Video Note  I connected with [name] on 03/19/19 at  2:40 PM EST by a video enabled telemedicine application and verified that I am speaking with the correct person using two identifiers.  Location: Patient: home Provider: office   I discussed the limitations of evaluation and management by telemedicine and the availability of in person appointments. The patient expressed understanding and agreed to proceed. Marin Roberts, CMA was able to get the patient set up on a visit, video   Subjective:    Patient ID: Cynthia Blanchard, female    DOB: 1975/12/26, 44 y.o.   MRN: [id number]  Chief Complaint  Patient presents with  . Insomnia    doing well on Xanax    HPI Patient is in today for follow up on chronic medical concerns. Overall she is doing well.no recent febrile illness or hospitalizations. Her depression and excessive anxiety after the death of her brother last year has subsided. She still has trouble sleeping at times but denies any significant trouble with anhedonia. Denies CP/palp/SOB/HA/congestion/fevers/GI or GU c/o. Taking meds as prescribed  Past Medical History:  Diagnosis Date  . Abdominal pain 09/15/2014  . Anxiety and depression 06/01/2016  . Asthma    as a child  . Cervical cancer screening 09/03/2014  . Chicken pox as a child  . Diabetes mellitus without complication ([place]) 1478   gestational diabetes- first pregnancy- not in the last 2 pregnancies  . Dysuria 12/09/2013  . Fatigue 11/28/2015  . Grief reaction 11/28/2015  . Hyperlipidemia 12/09/2013  . Insomnia 12/09/2013  . Preventative health care 09/03/2014  . Spondylolisthesis 12/09/2013  . Sun-damaged skin 09/15/2014  . Throat irritation 12/09/2013    Past Surgical History:  Procedure Laterality Date  . DILATION AND CURETTAGE OF UTERUS  2012  . FOOT SURGERY  8-10   right- cyst- size of a golf ball  . tube removed  2012   right tube  . tube ressected  2009   right tube    Family History    Problem Relation Age of Onset  . Hypertension Mother   . Obesity Father   . Depression Father   . Cancer Maternal Grandmother        skin  . Other Maternal Grandmother        valve replacement  . Cancer Maternal Grandfather        colon  . Diabetes Paternal Grandmother        type 2  . Stroke Paternal Grandmother   . Cancer Paternal Grandfather        prostate  . Mental illness Brother   . HIV Brother   . Drug abuse Brother   . Other Brother        Glomerular Nephritis    Social History   Socioeconomic History  . Marital status: Married    Spouse name: Not on file  . Number of children: Not on file  . Years of education: Not on file  . Highest education level: Not on file  Occupational History  . Not on file  Tobacco Use  . Smoking status: Never Smoker  . Smokeless tobacco: Never Used  Substance and Sexual Activity  . Alcohol use: Yes    Alcohol/week: 0.0 standard drinks    Comment: glass of wine every night  . Drug use: No  . 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 Not on file  Social History Narrative  . Not on file   Social Determinants of Health   Financial Resource Strain:   . Difficulty of Paying Living Expenses: Not on file  Food Insecurity:   . Worried About Charity fundraiser in the Last Year: Not on file  . Ran Out of Food in the Last Year: Not on file  Transportation Needs:   . Lack of Transportation (Medical): Not on file  . Lack of Transportation (Non-Medical): Not on file  Physical Activity:   . Days of Exercise per Week: Not on file  . Minutes of Exercise per Session: Not on file  Stress:   . Feeling of Stress : Not on file  Social Connections:   . Frequency of Communication with Friends and Family: Not on file  . Frequency of Social Gatherings with Friends and Family: Not on file  . Attends Religious  Services: Not on file  . Active Member of Clubs or Organizations: Not on file  . Attends Archivist Meetings: Not on file  . Marital Status: Not on file  Intimate Partner Violence:   . Fear of Current or Ex-Partner: Not on file  . Emotionally Abused: Not on file  . Physically Abused: Not on file  . Sexually Abused: Not on file    Outpatient Medications Prior to Visit  Medication Sig Dispense Refill  . Alum & Mag Hydroxide-Simeth (MAGIC MOUTHWASH) SOLN Take 5 mLs by mouth 4 (four) times daily as needed for mouth pain. 100 mL 0  . cyclobenzaprine (FLEXERIL) 10 MG tablet Take 1 tablet (10 mg total) by mouth 3 (three) times daily as needed for muscle spasms. 30 tablet 0  . FLUOROPLEX 1 % cream     . fluticasone (FLONASE) 50 MCG/ACT nasal spray Place 2 sprays into both nostrils daily. 16 g 6  . Probiotic Product (PROBIOTIC DAILY PO) Take by mouth.    . triamcinolone (KENALOG) 0.1 % paste Use as directed 1 application in the mouth or throat 2 (two) times daily. 5 g 1  . ALPRAZolam (XANAX) 0.5 MG tablet Take 1 tablet (0.5 mg total) by mouth 2 (two) times daily. 30 tablet 3  . escitalopram (LEXAPRO) 10 MG tablet Take 1 tablet (10 mg total) by mouth daily. 30 tablet 3  . hyoscyamine (LEVSIN SL) 0.125 MG SL tablet Place 1 tablet (0.125 mg total) under the tongue every 6 (six) hours as needed. 30 tablet 1  . metroNIDAZOLE (FLAGYL) 500 MG tablet Take 1 tablet (500 mg total) by mouth 3 (three) times daily. 21 tablet 0  . neomycin-polymyxin-hydrocortisone (CORTISPORIN) OTIC solution Place 3 drops into the right ear 3 (three) times daily. 10 mL 0  . sulfamethoxazole-trimethoprim (BACTRIM DS) 800-160 MG tablet Take 1 tablet by mouth 2 (two) times daily. 14 tablet 0   No facility-administered medications prior to visit.    Allergies  Allergen Reactions  . Latex Hives  . Penicillins Hives    Review of Systems  Constitutional: Negative for fever and malaise/fatigue.  HENT: Negative for  congestion.   Eyes: Negative for blurred vision.  Respiratory: Negative for shortness of breath.   Cardiovascular: Negative for chest pain, palpitations and leg swelling.  Gastrointestinal: Negative for abdominal pain, blood in stool and nausea.  Genitourinary: Negative for dysuria and frequency.  Musculoskeletal: Negative for falls.  Skin: Negative for rash.  Neurological: Negative for dizziness, loss of consciousness and headaches.  Endo/Heme/Allergies: Negative  for environmental allergies.  Psychiatric/Behavioral: Negative for depression. The patient has insomnia. The patient is not nervous/anxious.        Objective:    Physical Exam Constitutional:      Appearance: Normal appearance. She is normal weight. She is not ill-appearing.  HENT:     Head: Normocephalic and atraumatic.     Right Ear: External ear normal.     Left Ear: External ear normal.     Nose: Nose normal.  Eyes:     General:        Right eye: No discharge.        Left eye: No discharge.  Pulmonary:     Effort: Pulmonary effort is normal.  Neurological:     Mental Status: She is alert and oriented to person, place, and time.  Psychiatric:        Behavior: Behavior normal.     BP 110/68  Wt Readings from Last 3 Encounters:  12/16/17 131 lb 6.4 oz (59.6 kg)  10/27/17 129 lb 6.4 oz (58.7 kg)  06/03/17 129 lb 9.6 oz (58.8 kg)    Diabetic Foot Exam - Simple   No data filed     Lab Results  Component Value Date   WBC 5.5 10/27/2017   HGB 13.8 10/27/2017   HCT 41.1 10/27/2017   PLT 295.0 10/27/2017   GLUCOSE 85 10/27/2017   CHOL 162 12/10/2016   TRIG 37.0 12/10/2016   HDL 75.30 12/10/2016   LDLCALC 79 12/10/2016   ALT 18 10/27/2017   AST 22 10/27/2017   NA 138 10/27/2017   K 3.9 10/27/2017   CL 104 10/27/2017   CREATININE 0.68 10/27/2017   BUN 15 10/27/2017   CO2 28 10/27/2017   TSH 1.42 12/16/2017    Lab Results  Component Value Date   TSH 1.42 12/16/2017   Lab Results  Component  Value Date   WBC 5.5 10/27/2017   HGB 13.8 10/27/2017   HCT 41.1 10/27/2017   MCV 93.3 10/27/2017   PLT 295.0 10/27/2017   Lab Results  Component Value Date   NA 138 10/27/2017   K 3.9 10/27/2017   CO2 28 10/27/2017   GLUCOSE 85 10/27/2017   BUN 15 10/27/2017   CREATININE 0.68 10/27/2017   BILITOT 0.7 10/27/2017   ALKPHOS 56 10/27/2017   AST 22 10/27/2017   ALT 18 10/27/2017   PROT 7.5 10/27/2017   ALBUMIN 4.7 10/27/2017   CALCIUM 9.4 10/27/2017   GFR 101.02 10/27/2017   Lab Results  Component Value Date   CHOL 162 12/10/2016   Lab Results  Component Value Date   HDL 75.30 12/10/2016   Lab Results  Component Value Date   LDLCALC 79 12/10/2016   Lab Results  Component Value Date   TRIG 37.0 12/10/2016   Lab Results  Component Value Date   CHOLHDL 2 12/10/2016   No results found for: HGBA1C     Assessment & Plan:   Problem List Items Addressed This Visit    Insomnia    Encouraged good sleep hygiene such as dark, quiet room. No blue/green glowing lights such as computer screens in bedroom. No alcohol or stimulants in evening. Cut down on caffeine as able. Regular exercise is helpful but not just prior to bed time. Try Melatonin and she has been using Alprazolam to help as well with good results is allowed a refill      Hyperlipidemia    Encouraged heart healthy diet, increase exercise, avoid trans  fats, consider a krill oil cap daily      Abdominal pain    Avoid offending foods, start probiotics. Do not eat large meals in late evening and consider raising head of bed. Occurs less frequently but does get some relief from Hyoscyamine so patient can try to use it prn      Grief reaction    She struggled with the loss of her brother last year but she did not like the way the Lexapro made her feel, spacy for instance so she stopped it and she now feels much better. No changes today         I have discontinued Annalie Reger's metroNIDAZOLE,  neomycin-polymyxin-hydrocortisone, sulfamethoxazole-trimethoprim, and escitalopram. I am also having her maintain her Probiotic Product (PROBIOTIC DAILY PO), magic mouthwash, Fluoroplex, fluticasone, triamcinolone, cyclobenzaprine, hyoscyamine, and ALPRAZolam.  Meds ordered this encounter  Medications  . hyoscyamine (LEVSIN SL) 0.125 MG SL tablet    Sig: Place 1 tablet (0.125 mg total) under the tongue every 6 (six) hours as needed.    Dispense:  30 tablet    Refill:  1  . ALPRAZolam (XANAX) 0.5 MG tablet    Sig: Take 1 tablet (0.5 mg total) by mouth 2 (two) times daily.    Dispense:  30 tablet    Refill:  3     I discussed the assessment and treatment plan with the patient. The patient was provided an opportunity to ask questions and all were answered. The patient agreed with the plan and demonstrated an understanding of the instructions.   The patient was advised to call back or seek an in-person evaluation if the symptoms worsen or if the condition fails to improve as anticipated.  I provided 25 minutes of non-face-to-face time during this encounter.   Penni Homans, MD

## 2020-04-03 ENCOUNTER — Other Ambulatory Visit: Payer: Self-pay

## 2020-04-03 ENCOUNTER — Encounter: Payer: Self-pay | Admitting: Family Medicine

## 2020-04-03 ENCOUNTER — Ambulatory Visit (INDEPENDENT_AMBULATORY_CARE_PROVIDER_SITE_OTHER): Payer: 59 | Admitting: Family Medicine

## 2020-04-03 VITALS — BP 102/60 | HR 67 | Temp 98.2°F | Resp 16 | Ht 67.0 in | Wt 135.6 lb

## 2020-04-03 DIAGNOSIS — F3281 Premenstrual dysphoric disorder: Secondary | ICD-10-CM

## 2020-04-03 DIAGNOSIS — Z23 Encounter for immunization: Secondary | ICD-10-CM | POA: Diagnosis not present

## 2020-04-03 DIAGNOSIS — F419 Anxiety disorder, unspecified: Secondary | ICD-10-CM | POA: Diagnosis not present

## 2020-04-03 DIAGNOSIS — E7849 Other hyperlipidemia: Secondary | ICD-10-CM

## 2020-04-03 DIAGNOSIS — Z0000 Encounter for general adult medical examination without abnormal findings: Secondary | ICD-10-CM | POA: Diagnosis not present

## 2020-04-03 DIAGNOSIS — H53413 Scotoma involving central area, bilateral: Secondary | ICD-10-CM | POA: Diagnosis not present

## 2020-04-03 DIAGNOSIS — R251 Tremor, unspecified: Secondary | ICD-10-CM

## 2020-04-03 LAB — TSH: TSH: 0.68 u[IU]/mL (ref 0.35–4.50)

## 2020-04-03 LAB — CBC
HCT: 41 % (ref 36.0–46.0)
Hemoglobin: 13.8 g/dL (ref 12.0–15.0)
MCHC: 33.6 g/dL (ref 30.0–36.0)
MCV: 95.6 fl (ref 78.0–100.0)
Platelets: 287 10*3/uL (ref 150.0–400.0)
RBC: 4.29 Mil/uL (ref 3.87–5.11)
RDW: 13.3 % (ref 11.5–15.5)
WBC: 5.6 10*3/uL (ref 4.0–10.5)

## 2020-04-03 LAB — LIPID PANEL
Cholesterol: 160 mg/dL (ref 0–200)
HDL: 89.5 mg/dL (ref >39.00)
LDL Cholesterol: 64 mg/dL (ref 0–99)
NonHDL: 70.99
Total CHOL/HDL Ratio: 2
Triglycerides: 34 mg/dL (ref 0.0–149.0)
VLDL: 6.8 mg/dL (ref 0.0–40.0)

## 2020-04-03 LAB — COMPREHENSIVE METABOLIC PANEL
ALT: 18 U/L (ref 0–35)
AST: 26 U/L (ref 0–37)
Albumin: 4.7 g/dL (ref 3.5–5.2)
Alkaline Phosphatase: 51 U/L (ref 39–117)
BUN: 18 mg/dL (ref 6–23)
CO2: 29 mEq/L (ref 19–32)
Calcium: 9.7 mg/dL (ref 8.4–10.5)
Chloride: 103 mEq/L (ref 96–112)
Creatinine, Ser: 0.57 mg/dL (ref 0.40–1.20)
GFR: 110.78 mL/min (ref >60.00)
Glucose, Bld: 68 mg/dL — ABNORMAL LOW (ref 70–99)
Potassium: 4.2 mEq/L (ref 3.5–5.1)
Sodium: 139 mEq/L (ref 135–145)
Total Bilirubin: 0.8 mg/dL (ref 0.2–1.2)
Total Protein: 7.4 g/dL (ref 6.0–8.3)

## 2020-04-03 LAB — T4, FREE: Free T4: 0.84 ng/dL (ref 0.60–1.60)

## 2020-04-03 MED ORDER — SERTRALINE HCL 50 MG PO TABS: 25.0000 mg | ORAL_TABLET | Freq: Every day | ORAL | 3 refills | Status: DC

## 2020-04-03 MED ORDER — ALPRAZOLAM 0.5 MG PO TABS: 0.5000 mg | ORAL_TABLET | Freq: Two times a day (BID) | ORAL | 3 refills | Status: DC

## 2020-04-03 MED ORDER — NEOMYCIN-POLYMYXIN-HC 3.5-10000-1 OT SOLN: 3.0000 [drp] | Freq: Three times a day (TID) | OTIC | 0 refills | Status: DC

## 2020-04-03 NOTE — Patient Instructions (Addendum)
[place] lighting for SAD  [place] 15-44 Years Old, Female Preventive care refers to lifestyle choices and visits with your health care provider that can promote health and wellness. This includes:  A yearly physical exam. This is also called an annual wellness visit.  Regular dental and eye exams.  Immunizations.  Screening for certain conditions.  Healthy lifestyle choices, such as: ? Eating a healthy diet. ? Getting regular exercise. ? Not using drugs or products that contain nicotine and tobacco. ? Limiting alcohol use. What can I expect for my preventive care visit? Physical exam Your health care provider may check your:  Height and weight. These may be used to calculate your BMI (body mass index). BMI is a measurement that tells if you are at a healthy weight.  Heart rate and blood pressure.  Body temperature.  Skin for abnormal spots. Counseling Your health care provider may ask you questions about your:  Past medical problems.  Family's medical history.  Alcohol, tobacco, and drug use.  Emotional well-being.  Home life and relationship well-being.  Sexual activity.  Diet, exercise, and sleep habits.  Work and work Statistician.  Access to firearms.  Method of birth control.  Menstrual cycle.  Pregnancy history. What immunizations do I need? Vaccines are usually given at various ages, according to a schedule. Your health care provider will recommend vaccines for you based on your age, medical history, and lifestyle or other factors, such as travel or where you work.   What tests do I need? Blood tests  Lipid and cholesterol levels. These may be checked every 5 years starting at age 4.  Hepatitis C test.  Hepatitis B test. Screening  Diabetes screening. This is done by checking your blood sugar (glucose) after you have not eaten for a while (fasting).  STD (sexually transmitted disease) testing, if you are  at risk.  BRCA-related cancer screening. This may be done if you have a family history of breast, ovarian, tubal, or peritoneal cancers.  Pelvic exam and Pap test. This may be done every 3 years starting at age 15. Starting at age 34, this may be done every 5 years if you have a Pap test in combination with an HPV test. Talk with your health care provider about your test results, treatment options, and if necessary, the need for more tests.   Follow these instructions at home: Eating and drinking  Eat a healthy diet that includes fresh fruits and vegetables, whole grains, lean protein, and low-fat dairy products.  Take vitamin and mineral supplements as recommended by your health care provider.  Do not drink alcohol if: ? Your health care provider tells you not to drink. ? You are pregnant, may be pregnant, or are planning to become pregnant.  If you drink alcohol: ? Limit how much you have to 0-1 drink a day. ? Be aware of how much alcohol is in your drink. In the [place] one drink equals one 12 oz bottle of beer (355 mL), one 5 oz glass of wine (148 mL), or one 1 oz glass of hard liquor (44 mL).   Lifestyle  Take daily care of your teeth and gums. Brush your teeth every morning and night with fluoride toothpaste. Floss one time each day.  Stay active. Exercise for at least 30 minutes 5 or more days each week.  Do not use any products that contain nicotine or tobacco, such as cigarettes, e-cigarettes, and chewing tobacco. If you need help  ask your health care provider.  Do not use drugs.  If you are sexually active, practice safe sex. Use a condom or other form of protection to prevent STIs (sexually transmitted infections).  If you do not wish to become pregnant, use a form of birth control. If you plan to become pregnant, see your health care provider for a prepregnancy visit.  Find healthy ways to cope with stress, such as: ? Meditation, yoga, or listening to  music. ? Journaling. ? Talking to a trusted person. ? Spending time with friends and family. Safety  Always wear your seat belt while driving or riding in a vehicle.  Do not drive: ? If you have been drinking alcohol. Do not ride with someone who has been drinking. ? When you are tired or distracted. ? While texting.  Wear a helmet and other protective equipment during sports activities.  If you have firearms in your house, make sure you follow all gun safety procedures.  Seek help if you have been physically or sexually abused. What's next?  Go to your health care provider once a year for an annual wellness visit.  Ask your health care provider how often you should have your eyes and teeth checked.  Stay up to date on all vaccines. This information is not intended to replace advice given to you by your health care provider. Make sure you discuss any questions you have with your health care provider. Document Revised: 09/16/2019 Document Reviewed: 09/29/2017 Elsevier Patient Education  2021 Elsevier Inc.  

## 2020-04-06 DIAGNOSIS — F3281 Premenstrual dysphoric disorder: Secondary | ICD-10-CM | POA: Insufficient documentation

## 2020-04-06 DIAGNOSIS — H53419 Scotoma involving central area, unspecified eye: Secondary | ICD-10-CM | POA: Insufficient documentation

## 2020-04-06 NOTE — Progress Notes (Signed)
Subjective:    Patient ID: Artia Singley, female    DOB: 14-Feb-1975, 45 y.o.   MRN: [id number]  Chief Complaint  Patient presents with  . Annual Exam    HPI Patient is in today for annual preventative exam and follow up on chronic medical concerns. No recent febrile illness or hospitalizations. 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 Her children are now 7, 8 and 11 and keep her very busy. She notes she feels her is having some trouble with seasonal affective disorder and gets more irritable and down during the winter and now she also feels that the week prior to her cycle she gets more irritable and down. Notes poor concentration. She is also noting having some wavy lines appear in her field of vision at times. No other associated symptoms and no visual loss or headaches occurring. No trauma or other acute concerns. Denies CP/palp/SOB/HA/congestion/fevers/GI or GU c/o. Taking meds as prescribed  Past Medical History:  Diagnosis Date  . Abdominal pain 09/15/2014  . Anxiety and depression 06/01/2016  . Asthma    as a child  . Cervical cancer screening 09/03/2014  . Chicken pox as a child  . Diabetes mellitus without complication ([place]) 6283   gestational diabetes- first pregnancy- not in the last 2 pregnancies  . Dysuria 12/09/2013  . Fatigue 11/28/2015  . Grief reaction 11/28/2015  . Hyperlipidemia 12/09/2013  . Insomnia 12/09/2013  . Preventative health care 09/03/2014  . Spondylolisthesis 12/09/2013  . Sun-damaged skin 09/15/2014  . Throat irritation 12/09/2013    Past Surgical History:  Procedure Laterality Date  . DILATION AND CURETTAGE OF UTERUS  2012  . FOOT SURGERY  8-10   right- cyst- size of a golf ball  . tube removed  2012   right tube  . tube ressected  2009   right tube    Family History  Problem Relation Age of  Onset  . Hypertension Mother   . Obesity Father   . Depression Father   . Cancer Maternal Grandmother        skin  . Other Maternal Grandmother        valve replacement  . Cancer Maternal Grandfather        colon  . Diabetes Paternal Grandmother        type 2  . Stroke Paternal Grandmother   . Cancer Paternal Grandfather        prostate  . Mental illness Brother   . HIV Brother   . Drug abuse Brother   . Other Brother        Glomerular Nephritis    Social History   Socioeconomic History  . Marital status: Married    Spouse name: Not on file  . Number of children: Not on file  . Years of education: Not on file  . Highest education level: Not on file  Occupational History  . Not on file  Tobacco Use  . Smoking status: Never Smoker  . Smokeless tobacco: Never Used  Substance and Sexual Activity  . Alcohol use: Yes    Alcohol/week: 0.0 standard drinks    Comment: glass of wine every night  . Drug use: No  . 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 Not on file  Social History Narrative  . Not on file   Social Determinants of Health   Financial Resource Strain: Not on file  Food Insecurity: Not on file  Transportation Needs: Not on file  Physical Activity: Not on file  Stress: Not on file  Social Connections: Not on file  Intimate Partner Violence: Not on file    Outpatient Medications Prior to Visit  Medication Sig Dispense Refill  . cyclobenzaprine (FLEXERIL) 10 MG tablet Take 1 tablet (10 mg total) by mouth 3 (three) times daily as needed for muscle spasms. 30 tablet 0  . hyoscyamine (LEVSIN SL) 0.125 MG SL tablet Place 1 tablet (0.125 mg total) under the tongue every 6 (six) hours as needed. 30 tablet 1  . Probiotic Product (PROBIOTIC DAILY PO) Take by mouth.    . ALPRAZolam (XANAX) 0.5 MG tablet Take 1 tablet (0.5 mg total) by  mouth 2 (two) times daily. 30 tablet 3  . Alum & Mag Hydroxide-Simeth (MAGIC MOUTHWASH) SOLN Take 5 mLs by mouth 4 (four) times daily as needed for mouth pain. 100 mL 0  . FLUOROPLEX 1 % cream     . fluticasone (FLONASE) 50 MCG/ACT nasal spray Place 2 sprays into both nostrils daily. 16 g 6  . triamcinolone (KENALOG) 0.1 % paste Use as directed 1 application in the mouth or throat 2 (two) times daily. 5 g 1   No facility-administered medications prior to visit.    Allergies  Allergen Reactions  . Latex Hives  . Penicillins Hives    Review of Systems  Constitutional: Positive for malaise/fatigue. Negative for chills and fever.  HENT: Negative for congestion and hearing loss.   Eyes: Negative for blurred vision, double vision, photophobia, pain, discharge and redness.  Respiratory: Negative for cough, sputum production and shortness of breath.   Cardiovascular: Negative for chest pain, palpitations and leg swelling.  Gastrointestinal: Negative for abdominal pain, blood in stool, constipation, diarrhea, heartburn, nausea and vomiting.  Genitourinary: Negative for dysuria, frequency, hematuria and urgency.  Musculoskeletal: Negative for back pain, falls and myalgias.  Skin: Negative for rash.  Neurological: Negative for dizziness, sensory change, loss of consciousness, weakness and headaches.  Endo/Heme/Allergies: Negative for environmental allergies. Does not bruise/bleed easily.  Psychiatric/Behavioral: Negative for depression and suicidal ideas. The patient is nervous/anxious. The patient does not have insomnia.        Objective:    Physical Exam Constitutional:      General: She is not in acute distress.    Appearance: Normal appearance. She is well-developed and well-nourished. She is not ill-appearing.  HENT:     Head: Normocephalic and atraumatic.     Right Ear: Tympanic membrane, ear canal and external ear normal. There is no impacted cerumen.     Left Ear: Tympanic  membrane, ear canal and external ear normal. There is no impacted cerumen.  Eyes:     Conjunctiva/sclera: Conjunctivae normal.  Neck:     Thyroid: No thyromegaly.  Cardiovascular:     Rate and Rhythm: Normal rate and regular rhythm.     Heart sounds: Normal heart sounds. No murmur heard.   Pulmonary:     Effort: Pulmonary effort is normal. No respiratory distress.     Breath sounds: Normal breath sounds. No wheezing or rhonchi.  Abdominal:     General: Bowel sounds are normal. There is no distension.     Palpations: Abdomen  is soft. There is no mass.     Tenderness: There is no abdominal tenderness.  Musculoskeletal:        General: No edema.     Cervical back: Neck supple.  Lymphadenopathy:     Cervical: No cervical adenopathy.  Skin:    General: Skin is warm and dry.  Neurological:     Mental Status: She is alert and oriented to person, place, and time.     Coordination: Coordination normal.     Deep Tendon Reflexes: Reflexes normal.  Psychiatric:        Mood and Affect: Mood and affect normal.        Behavior: Behavior normal.     BP 102/60   Pulse 67   Temp 98.2 F (36.8 C)   Resp 16   Ht 5' 7"  (1.702 m)   Wt 135 lb 9.6 oz (61.5 kg)   SpO2 99%   BMI 21.24 kg/m  Wt Readings from Last 3 Encounters:  04/03/20 135 lb 9.6 oz (61.5 kg)  12/16/17 131 lb 6.4 oz (59.6 kg)  10/27/17 129 lb 6.4 oz (58.7 kg)    Diabetic Foot Exam - Simple   No data filed    Lab Results  Component Value Date   WBC 5.6 04/03/2020   HGB 13.8 04/03/2020   HCT 41.0 04/03/2020   PLT 287.0 04/03/2020   GLUCOSE 68 (L) 04/03/2020   CHOL 160 04/03/2020   TRIG 34.0 04/03/2020   HDL 89.50 04/03/2020   LDLCALC 64 04/03/2020   ALT 18 04/03/2020   AST 26 04/03/2020   NA 139 04/03/2020   K 4.2 04/03/2020   CL 103 04/03/2020   CREATININE 0.57 04/03/2020   BUN 18 04/03/2020   CO2 29 04/03/2020   TSH 0.68 04/03/2020    Lab Results  Component Value Date   TSH 0.68 04/03/2020   Lab  Results  Component Value Date   WBC 5.6 04/03/2020   HGB 13.8 04/03/2020   HCT 41.0 04/03/2020   MCV 95.6 04/03/2020   PLT 287.0 04/03/2020   Lab Results  Component Value Date   NA 139 04/03/2020   K 4.2 04/03/2020   CO2 29 04/03/2020   GLUCOSE 68 (L) 04/03/2020   BUN 18 04/03/2020   CREATININE 0.57 04/03/2020   BILITOT 0.8 04/03/2020   ALKPHOS 51 04/03/2020   AST 26 04/03/2020   ALT 18 04/03/2020   PROT 7.4 04/03/2020   ALBUMIN 4.7 04/03/2020   CALCIUM 9.7 04/03/2020   GFR 110.78 04/03/2020   Lab Results  Component Value Date   CHOL 160 04/03/2020   Lab Results  Component Value Date   HDL 89.50 04/03/2020   Lab Results  Component Value Date   LDLCALC 64 04/03/2020   Lab Results  Component Value Date   TRIG 34.0 04/03/2020   Lab Results  Component Value Date   CHOLHDL 2 04/03/2020   No results found for: HGBA1C     Assessment & Plan:   Problem List Items Addressed This Visit    Hyperlipidemia - Primary    Encouraged heart healthy diet, increase exercise, avoid trans fats, consider a krill oil cap daily      Relevant Orders   Lipid panel (Completed)   Preventative health care    Patient encouraged to maintain heart healthy diet, regular exercise, adequate sleep. Consider daily probiotics. Take medications as prescribed. Labs ordered and reviewed. Given Tdap today      Relevant Orders   CBC (  Completed)   Comprehensive metabolic panel (Completed)   Lipid panel (Completed)   TSH (Completed)   T4, free (Completed)   Anxiety   Relevant Medications   sertraline (ZOLOFT) 50 MG tablet   ALPRAZolam (XANAX) 0.5 MG tablet   PMDD (premenstrual dysphoric disorder)    With some possible SAD as well. Is encouraged to consider lights for SAD consider the [employer]. Also started on Sertraline 50 mg tabs, try taking 25-50 mg daily for the week prior to her cycle and consider taking it more if tolerated and she thinks it will help. She will report any  concerns. She does worry that she is beginning to have perimenopausal changes although her cycles are largely still regular.       Relevant Medications   sertraline (ZOLOFT) 50 MG tablet   ALPRAZolam (XANAX) 0.5 MG tablet   Scotoma    She describes some episodes of scotoma which appears to happen randomly and has been occurring off and on recently. No visual loss. No other acute concerns and no headache occurs. If they become worse or have associated symptoms she will let us know. Likely related to hormonal changes of perimenopause. She is encouraged to  increase hydration, 64 ounces of clear fluids daily. Minimize alcohol and caffeine. Eat small frequent meals with lean proteins and complex carbs. Avoid high and low blood sugars. Get adequate sleep, 7-8 hours a night. Needs exercise daily preferably in the morning.       Other Visit Diagnoses    Tremor       Relevant Orders   TSH (Completed)   T4, free (Completed)   Need for Tdap vaccination       Relevant Orders   Tdap vaccine greater than or equal to 7yo IM (Completed)      I have discontinued Deby Slaydon's magic mouthwash, Fluoroplex, fluticasone, and triamcinolone. I am also having her start on sertraline. Additionally, I am having her maintain her Probiotic Product (PROBIOTIC DAILY PO), cyclobenzaprine, hyoscyamine, ALPRAZolam, and neomycin-polymyxin-hydrocortisone.  Meds ordered this encounter  Medications  . sertraline (ZOLOFT) 50 MG tablet    Sig: Take 0.5-1 tablets (25-50 mg total) by mouth daily.    Dispense:  30 tablet    Refill:  3  . ALPRAZolam (XANAX) 0.5 MG tablet    Sig: Take 1 tablet (0.5 mg total) by mouth 2 (two) times daily.    Dispense:  30 tablet    Refill:  3  . neomycin-polymyxin-hydrocortisone (CORTISPORIN) OTIC solution    Sig: Place 3 drops into the right ear 3 (three) times daily.    Dispense:  10 mL    Refill:  0     Penni Homans, MD

## 2020-04-06 NOTE — Assessment & Plan Note (Addendum)
Patient encouraged to maintain heart healthy diet, regular exercise, adequate sleep. Consider daily probiotics. Take medications as prescribed. Labs ordered and reviewed. Given Tdap today

## 2020-04-06 NOTE — Assessment & Plan Note (Addendum)
With some possible SAD as well. Is encouraged to consider lights for SAD consider the [employer]. Also started on Sertraline 50 mg tabs, try taking 25-50 mg daily for the week prior to her cycle and consider taking it more if tolerated and she thinks it will help. She will report any concerns. She does worry that she is beginning to have perimenopausal changes although her cycles are largely still regular.

## 2020-04-06 NOTE — Assessment & Plan Note (Signed)
Encouraged heart healthy diet, increase exercise, avoid trans fats, consider a krill oil cap daily 

## 2020-04-06 NOTE — Assessment & Plan Note (Addendum)
She describes some episodes of scotoma which appears to happen randomly and has been occurring off and on recently. No visual loss. No other acute concerns and no headache occurs. If they become worse or have associated symptoms she will let us know. Likely related to hormonal changes of perimenopause. She is encouraged to  increase hydration, 64 ounces of clear fluids daily. Minimize alcohol and caffeine. Eat small frequent meals with lean proteins and complex carbs. Avoid high and low blood sugars. Get adequate sleep, 7-8 hours a night. Needs exercise daily preferably in the morning.

## 2020-04-27 ENCOUNTER — Other Ambulatory Visit: Payer: Self-pay | Admitting: Family Medicine

## 2020-07-07 ENCOUNTER — Telehealth: Payer: 59 | Admitting: Family Medicine

## 2020-10-07 ENCOUNTER — Encounter: Payer: Self-pay | Admitting: Family Medicine

## 2020-10-08 ENCOUNTER — Other Ambulatory Visit: Payer: Self-pay | Admitting: Family Medicine

## 2020-10-08 MED ORDER — PROMETHAZINE-DM 6.25-15 MG/5ML PO SYRP: 2.5000 mL | ORAL_SOLUTION | Freq: Three times a day (TID) | ORAL | 0 refills | Status: DC | PRN

## 2020-12-04 ENCOUNTER — Encounter: Payer: Self-pay | Admitting: Family Medicine

## 2020-12-04 MED ORDER — ALPRAZOLAM 0.5 MG PO TABS: 0.5000 mg | ORAL_TABLET | Freq: Two times a day (BID) | ORAL | 0 refills | Status: DC

## 2020-12-04 NOTE — Telephone Encounter (Signed)
Requesting: Xanax 0.85m Contract:06/03/17 UDS: 12/16/17 Last Visit: 04/03/20 Next Visit: 04/09/21 Last Refill: 04/03/20  #30 x 3 RF  Please Advise

## 2021-04-09 ENCOUNTER — Encounter: Payer: 59 | Admitting: Family Medicine

## 2021-04-17 ENCOUNTER — Ambulatory Visit (INDEPENDENT_AMBULATORY_CARE_PROVIDER_SITE_OTHER): Payer: 59 | Admitting: Family

## 2021-04-17 ENCOUNTER — Encounter: Payer: Self-pay | Admitting: Family

## 2021-04-17 ENCOUNTER — Other Ambulatory Visit (HOSPITAL_COMMUNITY)
Admission: RE | Admit: 2021-04-17 | Discharge: 2021-04-17 | Disposition: A | Discharge status: Home / Self Care | Payer: 59 | Source: Ambulatory Visit | Attending: Family Medicine | Admitting: Family Medicine

## 2021-04-17 VITALS — BP 116/71 | HR 76 | Temp 98.4°F | Resp 16 | Ht 67.0 in | Wt 132.0 lb

## 2021-04-17 DIAGNOSIS — R41840 Attention and concentration deficit: Secondary | ICD-10-CM

## 2021-04-17 DIAGNOSIS — Z01419 Encounter for gynecological examination (general) (routine) without abnormal findings: Secondary | ICD-10-CM | POA: Diagnosis present

## 2021-04-17 DIAGNOSIS — Z0000 Encounter for general adult medical examination without abnormal findings: Secondary | ICD-10-CM

## 2021-04-17 DIAGNOSIS — M5416 Radiculopathy, lumbar region: Secondary | ICD-10-CM | POA: Diagnosis not present

## 2021-04-17 DIAGNOSIS — Z1211 Encounter for screening for malignant neoplasm of colon: Secondary | ICD-10-CM

## 2021-04-17 NOTE — Assessment & Plan Note (Signed)
Recommended formal ADD testing for further evaluation.  ?

## 2021-04-17 NOTE — Addendum Note (Signed)
Addended by: Jiles Prows on: 04/17/2021 09:11 AM ? ? Modules accepted: Orders ? ?

## 2021-04-17 NOTE — Assessment & Plan Note (Addendum)
Encouraged pt to continue healthy diet and regular exercise. Pap performed today. Ordered mammogram. Declines colonoscopy but agreeable to cologuard.  ?

## 2021-04-17 NOTE — Patient Instructions (Signed)
Please complete lab work prior to leaving.   

## 2021-04-17 NOTE — Assessment & Plan Note (Signed)
Chronic. She would like a lumbar xray for further evaluation. Order placed.  ?

## 2021-04-17 NOTE — Progress Notes (Addendum)
? ?Subjective:  ? ?By signing my name below, I, Cynthia Blanchard, attest that this documentation has been prepared under the direction and in the presence of Cynthia Alar NP, 04/17/2021 ? ? Patient ID: Cynthia Blanchard, female    DOB: [birth year], 46 y.o.   MRN: [id number] ? ?Chief Complaint  ?Patient presents with  ? Annual Exam  ? ? ?HPI ?Patient is in today for a comprehensive physical exam.  ? ?Job Efficiency/Attention Span - Her attention span has affected her job efficiency.  ? ?Zoloft/Sertraline - She has discontinued use of Zoloft as it made her feel "weird."  She wonders if ADD is her problem rather than depression or anxiety.  ? ?Zanax - She takes 0.25 MG of Zanax on some nights.  ? ?Constipation/ Diarrhea - She occasionally gets constipation and diarrhea. ? ?She denies having any fever, ear pain, new muscle pain, joint pain, new moles, congestion, sinus pain, sore throat, palpations, wheezing, n/v, blood in stool, dysuria, frequency, hematuria at this time. ? ?Social History: She has no changes to her family medical history.  ?Colonoscopy: She wants to wait to schedule a colonoscopy. She is interested in a Cologuard. ?Pap Smear: Last completed 12/16/2017 ?Mammogram: Last completed 12/11/2016 ?Immunizations: She is not interested in taking the bivalent COVID - 19 vaccination.  ?Hepatitis C/ HIV Screening: She believes she had received a screening previously and declines today.  ?Diet: She eats well. ?Exercise: She does regularly exercise.  ? ? ?Health Maintenance Due  ?Topic Date Due  ? COVID-19 Vaccine (4 - Booster for Moderna series) 10/18/2020  ? PAP SMEAR-Modifier  12/16/2020  ? COLONOSCOPY (Pts 45-71yr Insurance coverage will need to be confirmed)  Never done  ? ? ?Past Medical History:  ?Diagnosis Date  ? Abdominal pain 09/15/2014  ? Anxiety and depression 06/01/2016  ? Asthma   ? as a child  ? Cervical cancer screening 09/03/2014  ? Chicken pox as a child  ? [place]  [id number] ? gestational diabetes- first pregnancy- not in the last 2 pregnancies  ? Dysuria 12/09/2013  ? Fatigue 11/28/2015  ? Grief reaction 11/28/2015  ? Hyperlipidemia 12/09/2013  ? Insomnia 12/09/2013  ? Preventative health care 09/03/2014  ? Spondylolisthesis 12/09/2013  ? Sun-damaged skin 09/15/2014  ? Throat irritation 12/09/2013  ? ? ?Past Surgical History:  ?Procedure Laterality Date  ? DILATION AND CURETTAGE OF UTERUS  2012  ? FOOT SURGERY  8-10  ? right- cyst- size of a golf ball  ? tube removed  2012  ? right tube  ? tube ressected  2009  ? right tube  ? ? ?Family History  ?Problem Relation Age of Onset  ? Hypertension Mother   ? Obesity Father   ? Depression Father   ? Cancer Maternal Grandmother   ?     skin  ? Other Maternal Grandmother   ?     valve replacement  ? Cancer Maternal Grandfather   ?     colon  ? Diabetes Paternal Grandmother   ?     type 2  ? Stroke Paternal Grandmother   ? Cancer Paternal Grandfather   ?     prostate  ? Mental illness Brother   ? HIV Brother   ? Drug abuse Brother   ? Other Brother   ?     Glomerular Nephritis  ? ? ?Social History  ? ?Socioeconomic History  ? Marital status: Married  ?  Spouse name: Not on file  ?  Number of children: Not on file  ? Years of education: Not on file  ? Highest education level: Not on file  ?Occupational History  ? Not on file  ?Tobacco Use  ? Smoking status: Never  ? Smokeless tobacco: Never  ?Substance and Sexual Activity  ? Alcohol use: Yes  ?  Alcohol/week: 0.0 standard drinks  ?  Comment: glass of wine every night  ? Drug use: No  ? Sexual activity: Yes  ?  Partners: Male  ?  Comment: live with husband and 3 children. works prn for Physical therapy, no dietarty restirctions  ?Other Topics Concern  ? Not on file  ?Social History Narrative  ? Works as a Community education officer  ? ?Social Determinants of Health  ? ?Financial Resource Strain: Not on file  ?Food Insecurity: Not on file  ?Transportation Needs: Not on file  ?Physical Activity: Not on file   ?Stress: Not on file  ?Social Connections: Not on file  ?Intimate Partner Violence: Not on file  ? ? ?Outpatient Medications Prior to Visit  ?Medication Sig Dispense Refill  ? ALPRAZolam (XANAX) 0.5 MG tablet Take 1 tablet (0.5 mg total) by mouth 2 (two) times daily. 30 tablet 0  ? hyoscyamine (LEVSIN SL) 0.125 MG SL tablet Place 1 tablet (0.125 mg total) under the tongue every 6 (six) hours as needed. 30 tablet 1  ? sertraline (ZOLOFT) 50 MG tablet Take 0.5-1 tablets (25-50 mg total) by mouth daily. 30 tablet 3  ? cyclobenzaprine (FLEXERIL) 10 MG tablet Take 1 tablet (10 mg total) by mouth 3 (three) times daily as needed for muscle spasms. 30 tablet 0  ? neomycin-polymyxin-hydrocortisone (CORTISPORIN) OTIC solution Place 3 drops into the right ear 3 (three) times daily. 10 mL 0  ? Probiotic Product (PROBIOTIC DAILY PO) Take by mouth.    ? promethazine-dextromethorphan (PROMETHAZINE-DM) 6.25-15 MG/5ML syrup Take 2.5-5 mLs by mouth 3 (three) times daily as needed for cough. 180 mL 0  ? ?No facility-administered medications prior to visit.  ? ? ?Allergies  ?Allergen Reactions  ? Latex Hives  ? Penicillins Hives  ? ? ?Review of Systems  ?Constitutional:  Negative for fever.  ?HENT:  Negative for congestion, ear pain, sinus pain and sore throat.   ?Cardiovascular:  Negative for palpitations.  ?Gastrointestinal:  Positive for constipation and diarrhea. Negative for blood in stool, nausea and vomiting.  ?Genitourinary:  Negative for dysuria, frequency and hematuria.  ?Musculoskeletal:  Negative for joint pain and myalgias.  ?Skin:   ?     (-) New Moles  ?Neurological:  Negative for headaches.  ?Psychiatric/Behavioral:    ?     (+) Inattention  ? ?   ?Objective:  ?  ?Physical Exam ?Exam conducted with a chaperone present.  ?Constitutional:   ?   General: She is not in acute distress. ?   Appearance: Normal appearance. She is not ill-appearing.  ?HENT:  ?   Head: Normocephalic and atraumatic.  ?   Right Ear: Tympanic  membrane, ear canal and external ear normal.  ?   Left Ear: Tympanic membrane, ear canal and external ear normal.  ?Eyes:  ?   Extraocular Movements: Extraocular movements intact.  ?   Pupils: Pupils are equal, round, and reactive to light.  ?Neck:  ?   Thyroid: No thyromegaly.  ?Cardiovascular:  ?   Rate and Rhythm: Normal rate and regular rhythm.  ?   Heart sounds: Normal heart sounds. No murmur heard. ?  No gallop.  ?Pulmonary:  ?  Effort: Pulmonary effort is normal. No respiratory distress.  ?   Breath sounds: Normal breath sounds. No wheezing or rales.  ?Chest:  ?Breasts: ?   Breasts are symmetrical.  ?   Right: No inverted nipple or mass.  ?   Left: No inverted nipple or mass.  ?Abdominal:  ?   General: Bowel sounds are normal. There is no distension.  ?   Palpations: Abdomen is soft.  ?   Tenderness: There is no abdominal tenderness. There is no guarding.  ?Genitourinary: ?   General: Normal vulva.  ?   Vagina: Normal.  ?   Cervix: Normal.  ?   Uterus: Normal.   ?   Adnexa: Right adnexa normal and left adnexa normal.    ?   Right: No mass.      ?   Left: No mass.    ?Musculoskeletal:  ?   Comments: 5/5 strength in both upper and lower extremities  ?Lymphadenopathy:  ?   Cervical: No cervical adenopathy.  ?Skin: ?   General: Skin is warm and dry.  ?Neurological:  ?   Mental Status: She is alert and oriented to person, place, and time.  ?   Deep Tendon Reflexes:  ?   Reflex Scores: ?     Patellar reflexes are 2+ on the right side and 2+ on the left side. ?Psychiatric:     ?   Mood and Affect: Mood normal.     ?   Behavior: Behavior normal.     ?   Judgment: Judgment normal.  ? ? ?BP 116/71 (BP Location: Left Arm, Patient Position: Sitting, Cuff Size: Small)   Pulse 76   Temp 98.4 ?F (36.9 ?C) (Oral)   Resp 16   Ht 5' 7"  (1.702 m)   Wt 132 lb (59.9 kg)   SpO2 100%   BMI 20.67 kg/m?  ?Wt Readings from Last 3 Encounters:  ?04/17/21 132 lb (59.9 kg)  ?04/03/20 135 lb 9.6 oz (61.5 kg)  ?12/16/17 131 lb 6.4  oz (59.6 kg)  ? ? ?   ?Assessment & Plan:  ? ?Problem List Items Addressed This Visit   ? ?  ? Unprioritized  ? Preventative health care - Primary  ?  Encouraged pt to continue healthy diet and regular exercise. Pap

## 2021-04-20 LAB — CYTOLOGY - PAP
Comment: NEGATIVE
Diagnosis: NEGATIVE
High risk HPV: NEGATIVE

## 2021-04-23 ENCOUNTER — Telehealth (HOSPITAL_BASED_OUTPATIENT_CLINIC_OR_DEPARTMENT_OTHER): Payer: Self-pay

## 2021-04-26 ENCOUNTER — Other Ambulatory Visit: Payer: Self-pay | Admitting: Family Medicine

## 2021-04-26 ENCOUNTER — Encounter: Payer: Self-pay | Admitting: Family

## 2021-04-27 MED ORDER — ALPRAZOLAM 0.5 MG PO TABS: 0.5000 mg | ORAL_TABLET | Freq: Two times a day (BID) | ORAL | 0 refills | Status: DC

## 2021-05-07 LAB — COLOGUARD: COLOGUARD: NEGATIVE

## 2021-05-25 ENCOUNTER — Ambulatory Visit (HOSPITAL_BASED_OUTPATIENT_CLINIC_OR_DEPARTMENT_OTHER)
Admission: RE | Admit: 2021-05-25 | Discharge: 2021-05-25 | Disposition: A | Discharge status: Home / Self Care | Payer: 59 | Source: Ambulatory Visit | Attending: Family | Admitting: Family

## 2021-05-25 ENCOUNTER — Encounter (HOSPITAL_BASED_OUTPATIENT_CLINIC_OR_DEPARTMENT_OTHER): Payer: Self-pay

## 2021-05-25 DIAGNOSIS — M5416 Radiculopathy, lumbar region: Secondary | ICD-10-CM

## 2021-05-25 DIAGNOSIS — Z0000 Encounter for general adult medical examination without abnormal findings: Secondary | ICD-10-CM | POA: Diagnosis present

## 2021-07-02 ENCOUNTER — Other Ambulatory Visit: Payer: Self-pay | Admitting: Family

## 2021-07-03 MED ORDER — ALPRAZOLAM 0.5 MG PO TABS: 0.5000 mg | ORAL_TABLET | Freq: Two times a day (BID) | ORAL | 0 refills | Status: DC

## 2021-07-03 NOTE — Telephone Encounter (Signed)
Requesting: xanax Contract: 05/08/21 UDS:12/16/2017 Last Visit:04/17/21 Next Visit:n/a Last Refill:04/27/21  Please Advise

## 2021-07-03 NOTE — Telephone Encounter (Signed)
Requesting:xanax 0.5 mg Contract:unknown [id number] Last Visit:04/17/21 Next Visit:unknown Last Refill:04/27/21  Please Advise

## 2021-07-30 ENCOUNTER — Ambulatory Visit: Payer: 59 | Admitting: Family Medicine

## 2021-08-28 ENCOUNTER — Telehealth: Payer: 59 | Admitting: Emergency Medicine

## 2021-08-28 DIAGNOSIS — J029 Acute pharyngitis, unspecified: Secondary | ICD-10-CM

## 2021-08-28 MED ORDER — CEFDINIR 300 MG PO CAPS: 300.0000 mg | ORAL_CAPSULE | Freq: Two times a day (BID) | ORAL | 0 refills | Status: AC

## 2021-08-28 NOTE — Progress Notes (Signed)
Virtual Visit Consent   Cynthia Blanchard, you are scheduled for a virtual visit with a [facility] provider today. Just as with appointments in the office, your consent must be obtained to participate. Your consent will be active for this visit and any virtual visit you may have with one of our providers in the next 365 days. If you have a MyChart account, a copy of this consent can be sent to you electronically.  As this is a virtual visit, video technology does not allow for your provider to perform a traditional examination. This may limit your provider's ability to fully assess your condition. If your provider identifies any concerns that need to be evaluated in person or the need to arrange testing (such as labs, EKG, etc.), we will make arrangements to do so. Although advances in technology are sophisticated, we cannot ensure that it will always work on either your end or our end. If the connection with a video visit is poor, the visit may have to be switched to a telephone visit. With either a video or telephone visit, we are not always able to ensure that we have a secure connection.  By engaging in this virtual visit, you consent to the provision of healthcare and authorize for your insurance to be billed (if applicable) for the services provided during this visit. Depending on your insurance coverage, you may receive a charge related to this service.  I need to obtain your verbal consent now. Are you willing to proceed with your visit today? Cynthia Blanchard has provided verbal consent on 08/28/2021 for a virtual visit (video or telephone). Montine Circle, PA-C  Date: 08/28/2021 11:23 AM  Virtual Visit via Video Note   I, Montine Circle, connected with  Cynthia Blanchard  ([id number], [birth year]) on 08/28/21 at 11:30 AM EDT by a video-enabled telemedicine application and verified that I am speaking with the correct person using two identifiers.  Location: Patient: Virtual Visit Location Patient:  Home Provider: Virtual Visit Location Provider: Home Office   I discussed the limitations of evaluation and management by telemedicine and the availability of in person appointments. The patient expressed understanding and agreed to proceed.    History of Present Illness: Cynthia Blanchard is a 46 y.o. who identifies as a female who was assigned female at birth, and is being seen today for sore throat.  States that she feels like she has had sore throat for 4 days and it feels like strep.  States that she has been taking ibuprofen and Tylenol.  Denies measured fever.  States that she has some pain radiating to the ears.  Denies cough.  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  HPI: HPI  Problems:  Patient Active Problem List   Diagnosis Date Noted   Attention deficit 04/17/2021   PMDD (premenstrual dysphoric disorder) 04/06/2020   Scotoma 04/06/2020   Otitis externa of right ear 06/06/2018   Anxiety 06/01/2016   Fatigue 11/28/2015   Grief reaction 11/28/2015   Back pain 11/19/2015   Sun-damaged skin 09/15/2014   Abdominal pain 09/15/2014   Cervical cancer screening 09/03/2014   Preventative health care 09/03/2014   Insomnia 12/09/2013   Spondylolisthesis 12/09/2013   Hyperlipidemia 12/09/2013   Chicken pox     Allergies:  Allergies  Allergen Reactions   Latex Hives   Penicillins Hives   Medications:  Current Outpatient Medications:    ALPRAZolam (XANAX) 0.5 MG tablet, Take 1 tablet (  0.5 mg total) by mouth 2 (two) times daily., Disp: 30 tablet, Rfl: 0   hyoscyamine (LEVSIN SL) 0.125 MG SL tablet, Place 1 tablet (0.125 mg total) under the tongue every 6 (six) hours as needed., Disp: 30 tablet, Rfl: 1  Observations/Objective: Patient is well-developed, well-nourished in no acute distress.  Resting comfortably  at home.  Head is normocephalic, atraumatic.  No labored breathing.  Speech is clear and coherent with logical  content.  Patient is alert and oriented at baseline. Normal voice, tolerating secretions.    Assessment and Plan: 1. Pharyngitis, unspecified etiology  -Trial Cefdinir. -Home COVID test   Follow Up Instructions: I discussed the assessment and treatment plan with the patient. The patient was provided an opportunity to ask questions and all were answered. The patient agreed with the plan and demonstrated an understanding of the instructions.  A copy of instructions were sent to the patient via MyChart unless otherwise noted below.     The patient was advised to call back or seek an in-person evaluation if the symptoms worsen or if the condition fails to improve as anticipated.  Time:  I spent 11 minutes with the patient via telehealth technology discussing the above problems/concerns.    Montine Circle, PA-C

## 2021-09-23 ENCOUNTER — Other Ambulatory Visit: Payer: Self-pay | Admitting: Family Medicine

## 2021-09-23 ENCOUNTER — Encounter: Payer: Self-pay | Admitting: Family Medicine

## 2021-09-23 MED ORDER — SERTRALINE HCL 50 MG PO TABS: 50.0000 mg | ORAL_TABLET | Freq: Every day | ORAL | 3 refills | Status: DC

## 2021-09-23 MED ORDER — ALPRAZOLAM 0.5 MG PO TABS: 0.5000 mg | ORAL_TABLET | Freq: Two times a day (BID) | ORAL | 0 refills | Status: DC

## 2021-09-23 NOTE — Telephone Encounter (Signed)
Requesting:xanax 0.5 mg Contract:06/03/17 UDS:12/16/17 Last Visit:04/17/21 Next Visit:unknown Last Refill:07/03/21  Please Advise

## 2021-09-23 NOTE — Telephone Encounter (Signed)
Called pt was advised and appt was made

## 2021-10-17 ENCOUNTER — Other Ambulatory Visit: Payer: Self-pay | Admitting: Family Medicine

## 2021-11-02 ENCOUNTER — Telehealth (INDEPENDENT_AMBULATORY_CARE_PROVIDER_SITE_OTHER): Payer: 59 | Admitting: Family Medicine

## 2021-11-02 VITALS — Wt 130.0 lb

## 2021-11-02 DIAGNOSIS — R251 Tremor, unspecified: Secondary | ICD-10-CM | POA: Diagnosis not present

## 2021-11-02 DIAGNOSIS — F3281 Premenstrual dysphoric disorder: Secondary | ICD-10-CM

## 2021-11-02 DIAGNOSIS — R5383 Other fatigue: Secondary | ICD-10-CM

## 2021-11-02 DIAGNOSIS — E7849 Other hyperlipidemia: Secondary | ICD-10-CM

## 2021-11-02 DIAGNOSIS — M549 Dorsalgia, unspecified: Secondary | ICD-10-CM | POA: Diagnosis not present

## 2021-11-02 MED ORDER — SERTRALINE HCL 50 MG PO TABS: ORAL_TABLET | ORAL | 0 refills | Status: DC

## 2021-11-02 NOTE — Assessment & Plan Note (Signed)
Use moist heat and gentle stretching as tolerated. May try NSAIDs and prescription meds as directed and report if symptoms worsen or seek immediate care

## 2021-11-02 NOTE — Assessment & Plan Note (Signed)
She notes this occurs almost exclusively when she is the week prior to her cycle.  She does have a history of gestational diabetes and questions whether this could be episodes of low blood sugar.  We will repeat lab work and she is encouraged to eat protein every 4 hours throughout the day and before bedtime next month to see if that helps the tremulousness.  We will also proceed with lab work.

## 2021-11-02 NOTE — Assessment & Plan Note (Signed)
She had stopped her Sertraline but as she continued to struggle after the loss of her brother she noted that she got worse just prior to her menstrual cycle she got worse so she started taking 50 mg daily for the 10 days leading up to her cycle and that has been very helpful. She will continue to use this dosing although she can consider taking 1/2 tab daily for the last 2 days to ease coming off of it. We also discussed the fact that it is worse in general during the winter months so she may consider taking it daily during those months. Discussed history, current state and plan of care for 30 minutes

## 2021-11-02 NOTE — Progress Notes (Signed)
MyChart Video Visit    Virtual Visit via Video Note   This visit type was conducted due to national recommendations for restrictions regarding the COVID-19 Pandemic (e.g. social distancing) in an effort to limit this patient's exposure and mitigate transmission in our community. This patient is at least at moderate risk for complications without adequate follow up. This format is felt to be most appropriate for this patient at this time. Physical exam was limited by quality of the video and audio technology used for the visit. [name], CMA was able to get the patient set up on a video visit.  Patient location: home Patient and provider in visit Provider location: Office  I discussed the limitations of evaluation and management by telemedicine and the availability of in person appointments. The patient expressed understanding and agreed to proceed.  Visit Date: 11/02/2021  Today's healthcare provider: Penni Homans, MD     Subjective:    Patient ID: Cynthia Blanchard, female    DOB: [birth year], 46 y.o.   MRN: [id number]  Chief Complaint  Patient presents with   Follow-up    HPI Patient is in today for follow up on chronic medical concerns and medication changes.  She is overall doing well.  No recent febrile illness or hospitalizations.  After the loss of her brother earlier this year she has had increased anhedonia anxiety and depression.  She noted it was worse the 10 days prior to her menstrual cycle so she restarted sertraline at 50 mg daily during those 10 days and has noted a marked improvement.  She does acknowledge that winter months are usually worse as well.  At this point she feels the dosing is helpful and she does not need further adjustments.  No suicidal ideation is noted.  She does note her other brother is also had poor health lately so she is struggling with ongoing anxiety and fatigue.  She also notes some episodes of tremulousness are occurring in those days prior to  her cycles and she questions whether her blood sugar could be dropping during those days. Denies CP/palp/SOB/HA/congestion/fevers/GI or GU c/o. Taking meds as prescribed   Past Medical History:  Diagnosis Date   Abdominal pain 09/15/2014   Anxiety and depression 06/01/2016   Asthma    as a child   Cervical cancer screening 09/03/2014   Chicken pox as a child   Diabetes mellitus without complication ([place]) 6734   gestational diabetes- first pregnancy- not in the last 2 pregnancies   Dysuria 12/09/2013   Fatigue 11/28/2015   Grief reaction 11/28/2015   Hyperlipidemia 12/09/2013   Insomnia 12/09/2013   Preventative health care 09/03/2014   Spondylolisthesis 12/09/2013   Sun-damaged skin 09/15/2014   Throat irritation 12/09/2013    Past Surgical History:  Procedure Laterality Date   DILATION AND CURETTAGE OF UTERUS  2012   FOOT SURGERY  8-10   right- cyst- size of a golf ball   tube removed  2012   right tube   tube ressected  2009   right tube    Family History  Problem Relation Age of Onset   Hypertension Mother    Obesity Father    Depression Father    Cancer Maternal Grandmother        skin   Other Maternal Grandmother        valve replacement   Cancer Maternal Grandfather        colon   Diabetes Paternal Grandmother  type 2   Stroke Paternal Grandmother    Cancer Paternal Grandfather        prostate   Mental illness Brother    HIV Brother    Drug abuse Brother    Other Brother        Glomerular Nephritis    Social History   Socioeconomic History   Marital status: Married    Spouse name: Not on file   Number of children: Not on file   Years of education: Not on file   Highest education level: Not on file  Occupational History   Not on file  Tobacco Use   Smoking status: Never   Smokeless tobacco: Never  Substance and Sexual Activity   Alcohol use: Yes    Alcohol/week: 0.0 standard drinks of alcohol    Comment: glass of wine every night   Drug use: No    Sexual activity: Yes    Partners: Male    Comment: live with husband and 3 children. 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 30 tablet 0   hyoscyamine (LEVSIN SL) 0.125 MG SL tablet Place 1 tablet (0.125 mg total) under the tongue every 6 (six) hours as needed. 30 tablet 1   sertraline (ZOLOFT) 50 MG tablet Take 1 tablet (50 mg total) by mouth daily. 90 tablet 0   No facility-administered medications prior to visit.    Allergies  Allergen Reactions   Latex Hives   Penicillins Hives    Review of Systems  Constitutional:  Negative for fever and malaise/fatigue.  HENT:  Negative for congestion.   Eyes:  Negative for blurred vision.  Respiratory:  Negative for shortness of breath.   Cardiovascular:  Negative for chest pain, palpitations and leg swelling.  Gastrointestinal:  Negative for abdominal pain, blood in stool and nausea.  Genitourinary:  Negative for dysuria and frequency.  Musculoskeletal:  Negative for falls.  Skin:  Negative for rash.  Neurological:  Positive for tremors. Negative for dizziness, loss of consciousness and headaches.  Endo/Heme/Allergies:  Negative for environmental allergies.  Psychiatric/Behavioral:  Negative for depression. The patient is nervous/anxious.        Objective:    Physical Exam Constitutional:      General: She is not in acute distress.    Appearance: Normal appearance. She is not  ill-appearing or toxic-appearing.  HENT:     Head: Normocephalic and atraumatic.     Right Ear: External ear normal.     Left Ear: External ear normal.     Nose: Nose normal.  Eyes:     General:        Right eye: No discharge.        Left eye: No discharge.  Pulmonary:     Effort: Pulmonary effort is normal.  Skin:    Findings: No rash.  Neurological:     Mental Status: She is alert and oriented  to person, place, and time.  Psychiatric:        Behavior: Behavior normal.     Wt 130 lb (59 kg)   BMI 20.36 kg/m  Wt Readings from Last 3 Encounters:  11/02/21 130 lb (59 kg)  04/17/21 132 lb (59.9 kg)  04/03/20 135 lb 9.6 oz (61.5 kg)    Diabetic Foot Exam - Simple   No data filed    Lab Results  Component Value Date   WBC 5.6 04/03/2020   HGB 13.8 04/03/2020   HCT 41.0 04/03/2020   PLT 287.0 04/03/2020   GLUCOSE 68 (L) 04/03/2020   CHOL 160 04/03/2020   TRIG 34.0 04/03/2020   HDL 89.50 04/03/2020   LDLCALC 64 04/03/2020   ALT 18 04/03/2020   AST 26 04/03/2020   NA 139 04/03/2020   K 4.2 04/03/2020   CL 103 04/03/2020   CREATININE 0.57 04/03/2020   BUN 18 04/03/2020   CO2 29 04/03/2020   TSH 0.68 04/03/2020    Lab Results  Component Value Date   TSH 0.68 04/03/2020   Lab Results  Component Value Date   WBC 5.6 04/03/2020   HGB 13.8 04/03/2020   HCT 41.0 04/03/2020   MCV 95.6 04/03/2020   PLT 287.0 04/03/2020   Lab Results  Component Value Date   NA 139 04/03/2020   K 4.2 04/03/2020   CO2 29 04/03/2020   GLUCOSE 68 (L) 04/03/2020   BUN 18 04/03/2020   CREATININE 0.57 04/03/2020   BILITOT 0.8 04/03/2020   ALKPHOS 51 04/03/2020   AST 26 04/03/2020   ALT 18 04/03/2020   PROT 7.4 04/03/2020   ALBUMIN 4.7 04/03/2020   CALCIUM 9.7 04/03/2020   GFR 110.78 04/03/2020   Lab Results  Component Value Date   CHOL 160 04/03/2020   Lab Results  Component Value Date   HDL 89.50 04/03/2020   Lab Results  Component Value Date   LDLCALC 64  04/03/2020   Lab Results  Component Value Date   TRIG 34.0 04/03/2020   Lab Results  Component Value Date   CHOLHDL 2 04/03/2020   No results found for: "HGBA1C"     Assessment & Plan:   Problem List Items Addressed This Visit     Hyperlipidemia - Primary   Relevant Orders   Lipid panel   Back pain    Use moist heat and gentle stretching as tolerated. May try NSAIDs and prescription meds as directed and report if symptoms worsen or seek immediate care      Relevant Orders   CBC   Fatigue   Relevant Orders   Comprehensive metabolic panel   TSH   PMDD (premenstrual dysphoric disorder)    She had stopped her Sertraline but as she continued to struggle after the loss of her brother she noted that she got worse just prior to her menstrual cycle she got worse so she started taking 50 mg daily for the 10 days leading up to her cycle and that has been very helpful. She will continue to use this dosing although she can consider taking 1/2 tab daily for the last 2 days to ease coming off of it. We also discussed the fact that it is worse in general during the winter months so she may consider taking it daily during those months. Discussed history, current state and plan of care for 30 minutes      Relevant Medications   sertraline (ZOLOFT) 50 MG tablet   Tremulousness  She notes this occurs almost exclusively when she is the week prior to her cycle.  She does have a history of gestational diabetes and questions whether this could be episodes of low blood sugar.  We will repeat lab work and she is encouraged to eat protein every 4 hours throughout the day and before bedtime next month to see if that helps the tremulousness.  We will also proceed with lab work.      Relevant Orders   Comprehensive metabolic panel   TSH    I have changed Diamone Deaton's sertraline. I am also having her maintain her hyoscyamine and ALPRAZolam.  Meds ordered this encounter  Medications    sertraline (ZOLOFT) 50 MG tablet    Sig: 1 tab po daily for the 10 days prior to her menstrual cycle each month    Dispense:  90 tablet    Refill:  0    I discussed the assessment and treatment plan with the patient. The patient was provided an opportunity to ask questions and all were answered. The patient agreed with the plan and demonstrated an understanding of the instructions.   The patient was advised to call back or seek an in-person evaluation if the symptoms worsen or if the condition fails to improve as anticipated.  I provided 35 minutes of face-to-face time during this encounter.   Penni Homans, MD [facility] [phone] (phone) [phone] (fax)  [place]

## 2021-11-09 ENCOUNTER — Encounter: Payer: Self-pay | Admitting: Family Medicine

## 2021-11-09 ENCOUNTER — Other Ambulatory Visit (INDEPENDENT_AMBULATORY_CARE_PROVIDER_SITE_OTHER): Payer: 59

## 2021-11-09 DIAGNOSIS — E7849 Other hyperlipidemia: Secondary | ICD-10-CM

## 2021-11-09 DIAGNOSIS — R5383 Other fatigue: Secondary | ICD-10-CM | POA: Diagnosis not present

## 2021-11-09 DIAGNOSIS — R251 Tremor, unspecified: Secondary | ICD-10-CM

## 2021-11-09 DIAGNOSIS — M549 Dorsalgia, unspecified: Secondary | ICD-10-CM

## 2021-11-09 LAB — COMPREHENSIVE METABOLIC PANEL
ALT: 15 U/L (ref 0–35)
AST: 21 U/L (ref 0–37)
Albumin: 4.2 g/dL (ref 3.5–5.2)
Alkaline Phosphatase: 52 U/L (ref 39–117)
BUN: 12 mg/dL (ref 6–23)
CO2: 27 mEq/L (ref 19–32)
Calcium: 9.1 mg/dL (ref 8.4–10.5)
Chloride: 103 mEq/L (ref 96–112)
Creatinine, Ser: 0.63 mg/dL (ref 0.40–1.20)
GFR: 106.93 mL/min (ref >60.00)
Glucose, Bld: 116 mg/dL — ABNORMAL HIGH (ref 70–99)
Potassium: 3.8 mEq/L (ref 3.5–5.1)
Sodium: 140 mEq/L (ref 135–145)
Total Bilirubin: 0.8 mg/dL (ref 0.2–1.2)
Total Protein: 6.6 g/dL (ref 6.0–8.3)

## 2021-11-09 LAB — LIPID PANEL
Cholesterol: 159 mg/dL (ref 0–200)
HDL: 83.7 mg/dL (ref >39.00)
LDL Cholesterol: 65 mg/dL (ref 0–99)
NonHDL: 74.8
Total CHOL/HDL Ratio: 2
Triglycerides: 49 mg/dL (ref 0.0–149.0)
VLDL: 9.8 mg/dL (ref 0.0–40.0)

## 2021-11-09 LAB — CBC
HCT: 37.9 % (ref 36.0–46.0)
Hemoglobin: 12.6 g/dL (ref 12.0–15.0)
MCHC: 33.3 g/dL (ref 30.0–36.0)
MCV: 96.3 fl (ref 78.0–100.0)
Platelets: 244 10*3/uL (ref 150.0–400.0)
RBC: 3.93 Mil/uL (ref 3.87–5.11)
RDW: 14.5 % (ref 11.5–15.5)
WBC: 5.3 10*3/uL (ref 4.0–10.5)

## 2021-11-09 LAB — TSH: TSH: 1.25 u[IU]/mL (ref 0.35–5.50)

## 2021-11-10 ENCOUNTER — Other Ambulatory Visit (INDEPENDENT_AMBULATORY_CARE_PROVIDER_SITE_OTHER): Payer: 59

## 2021-11-10 ENCOUNTER — Other Ambulatory Visit: Payer: Self-pay | Admitting: *Deleted

## 2021-11-10 DIAGNOSIS — R739 Hyperglycemia, unspecified: Secondary | ICD-10-CM | POA: Diagnosis not present

## 2021-11-11 LAB — HEMOGLOBIN A1C: Hgb A1c MFr Bld: 5.5 % (ref 4.6–6.5)

## 2021-12-11 ENCOUNTER — Other Ambulatory Visit: Payer: Self-pay | Admitting: Family Medicine

## 2021-12-11 MED ORDER — ALPRAZOLAM 0.5 MG PO TABS: 0.5000 mg | ORAL_TABLET | Freq: Two times a day (BID) | ORAL | 0 refills | Status: DC

## 2022-01-14 ENCOUNTER — Other Ambulatory Visit: Payer: Self-pay | Admitting: Family Medicine

## 2022-02-25 ENCOUNTER — Encounter (INDEPENDENT_AMBULATORY_CARE_PROVIDER_SITE_OTHER): Payer: 59 | Admitting: Family Medicine

## 2022-02-25 DIAGNOSIS — R519 Headache, unspecified: Secondary | ICD-10-CM

## 2022-02-25 DIAGNOSIS — R059 Cough, unspecified: Secondary | ICD-10-CM

## 2022-02-25 MED ORDER — HYDROCODONE BIT-HOMATROP MBR 5-1.5 MG/5ML PO SOLN: 5.0000 mL | Freq: Three times a day (TID) | ORAL | 0 refills | Status: AC | PRN

## 2022-02-25 NOTE — Addendum Note (Signed)
Addended by: Lamar Blinks C on: 02/25/2022 05:03 PM   Modules accepted: Orders

## 2022-02-25 NOTE — Telephone Encounter (Signed)

## 2022-02-26 MED ORDER — ALBUTEROL SULFATE HFA 108 (90 BASE) MCG/ACT IN AERS: 2.0000 | INHALATION_SPRAY | Freq: Four times a day (QID) | RESPIRATORY_TRACT | 2 refills | Status: DC | PRN

## 2022-02-26 NOTE — Addendum Note (Signed)
Addended by: Lamar Blinks C on: 02/26/2022 07:50 AM   Modules accepted: Orders

## 2022-03-01 ENCOUNTER — Telehealth: Payer: Self-pay | Admitting: *Deleted

## 2022-03-01 ENCOUNTER — Encounter: Payer: Self-pay | Admitting: Family Medicine

## 2022-03-01 MED ORDER — DOXYCYCLINE HYCLATE 100 MG PO CAPS: 100.0000 mg | ORAL_CAPSULE | Freq: Two times a day (BID) | ORAL | 0 refills | Status: DC

## 2022-03-01 NOTE — Addendum Note (Signed)
Addended by: Lamar Blinks C on: 03/01/2022 01:02 PM   Modules accepted: Orders

## 2022-03-01 NOTE — Telephone Encounter (Signed)
[facility] sent over fax stating that Hycodan is on backorder/unavailable.

## 2022-03-02 ENCOUNTER — Telehealth: Payer: Self-pay

## 2022-03-02 NOTE — Telephone Encounter (Signed)
Hycodan syrup on back order per [facility]. Requesting alternative. Please advise.

## 2022-03-02 NOTE — Telephone Encounter (Signed)
Disregard

## 2022-03-03 MED ORDER — PROMETHAZINE-DM 6.25-15 MG/5ML PO SYRP: 5.0000 mL | ORAL_SOLUTION | Freq: Four times a day (QID) | ORAL | 0 refills | Status: DC | PRN

## 2022-03-03 NOTE — Addendum Note (Signed)
Addended by: Lamar Blinks C on: 03/03/2022 09:13 PM   Modules accepted: Orders

## 2022-04-26 ENCOUNTER — Other Ambulatory Visit: Payer: Self-pay | Admitting: Family Medicine

## 2022-04-26 ENCOUNTER — Encounter: Payer: 59 | Admitting: Family Medicine

## 2022-04-26 MED ORDER — ALPRAZOLAM 0.5 MG PO TABS: 0.5000 mg | ORAL_TABLET | Freq: Two times a day (BID) | ORAL | 0 refills | Status: DC

## 2022-04-26 NOTE — Telephone Encounter (Signed)
Requesting: alprazolam 0.5mg   Contract: 06/03/2017 UDS: 12/16/2017 Last Visit: 11/02/21 Next Visit: 05/17/22 Last Refill: 12/11/21 #30 and 0RF   Please Advise

## 2022-05-16 NOTE — Assessment & Plan Note (Signed)
Patient encouraged to maintain heart healthy diet, regular exercise, adequate sleep. Consider daily probiotics. Take medications as prescribed. Labs ordered and reviewed. Given and reviewed copy of ACP documents from [employer] and encouraged to complete and return. MGM April 2023 repeat this year.  Colonoscopy is due Pap March 2023 repeat in 2026-28

## 2022-05-16 NOTE — Assessment & Plan Note (Signed)
Stable on current meds 

## 2022-05-16 NOTE — Assessment & Plan Note (Signed)
Encourage heart healthy diet such as MIND or DASH diet, increase exercise, avoid trans fats, simple carbohydrates and processed foods, consider a krill or fish or flaxseed oil cap daily.  °

## 2022-05-17 ENCOUNTER — Encounter: Payer: Self-pay | Admitting: Family Medicine

## 2022-05-17 ENCOUNTER — Ambulatory Visit (INDEPENDENT_AMBULATORY_CARE_PROVIDER_SITE_OTHER): Payer: 59 | Admitting: Family Medicine

## 2022-05-17 VITALS — BP 105/64 | HR 66 | Temp 98.0°F | Resp 16 | Ht 67.0 in | Wt 137.0 lb

## 2022-05-17 DIAGNOSIS — F3281 Premenstrual dysphoric disorder: Secondary | ICD-10-CM

## 2022-05-17 DIAGNOSIS — R197 Diarrhea, unspecified: Secondary | ICD-10-CM

## 2022-05-17 DIAGNOSIS — Z0000 Encounter for general adult medical examination without abnormal findings: Secondary | ICD-10-CM | POA: Diagnosis not present

## 2022-05-17 DIAGNOSIS — F419 Anxiety disorder, unspecified: Secondary | ICD-10-CM

## 2022-05-17 DIAGNOSIS — E7849 Other hyperlipidemia: Secondary | ICD-10-CM

## 2022-05-17 DIAGNOSIS — Z79899 Other long term (current) drug therapy: Secondary | ICD-10-CM | POA: Diagnosis not present

## 2022-05-17 DIAGNOSIS — Z1239 Encounter for other screening for malignant neoplasm of breast: Secondary | ICD-10-CM

## 2022-05-17 DIAGNOSIS — R739 Hyperglycemia, unspecified: Secondary | ICD-10-CM | POA: Diagnosis not present

## 2022-05-17 DIAGNOSIS — G4700 Insomnia, unspecified: Secondary | ICD-10-CM

## 2022-05-17 NOTE — Assessment & Plan Note (Signed)
Using Sertraline roughly 1/2 of the month with good results

## 2022-05-17 NOTE — Patient Instructions (Addendum)
[employer] company multivitamin, fish oil and vitamin D, fiber and probiotic  Preventive Care 33-46 Years Old, Female Preventive care refers to lifestyle choices and visits with your health care provider that can promote health and wellness. Preventive care visits are also called wellness exams. What can I expect for my preventive care visit? Counseling Your health care provider may ask you questions about your: Medical history, including: Past medical problems. Family medical history. Pregnancy history. Current health, including: Menstrual cycle. Method of birth control. Emotional well-being. Home life and relationship well-being. Sexual activity and sexual health. Lifestyle, including: Alcohol, nicotine or tobacco, and drug use. Access to firearms. Diet, exercise, and sleep habits. Work and work Astronomer. Sunscreen use. Safety issues such as seatbelt and bike helmet use. Physical exam Your health care provider will check your: Height and weight. These may be used to calculate your BMI (body mass index). BMI is a measurement that tells if you are at a healthy weight. Waist circumference. This measures the distance around your waistline. This measurement also tells if you are at a healthy weight and may help predict your risk of certain diseases, such as type 2 diabetes and high blood pressure. Heart rate and blood pressure. Body temperature. Skin for abnormal spots. What immunizations do I need?  Vaccines are usually given at various ages, according to a schedule. Your health care provider will recommend vaccines for you based on your age, medical history, and lifestyle or other factors, such as travel or where you work. What tests do I need? Screening Your health care provider may recommend screening tests for certain conditions. This may include: Lipid and cholesterol levels. Diabetes screening. This is done by checking your blood sugar (glucose) after you have not eaten for a  while (fasting). Pelvic exam and Pap test. Hepatitis B test. Hepatitis C test. HIV (human immunodeficiency virus) test. STI (sexually transmitted infection) testing, if you are at risk. Lung cancer screening. Colorectal cancer screening. Mammogram. Talk with your health care provider about when you should start having regular mammograms. This may depend on whether you have a family history of breast cancer. BRCA-related cancer screening. This may be done if you have a family history of breast, ovarian, tubal, or peritoneal cancers. Bone density scan. This is done to screen for osteoporosis. Talk with your health care provider about your test results, treatment options, and if necessary, the need for more tests. Follow these instructions at home: Eating and drinking  Eat a diet that includes fresh fruits and vegetables, whole grains, lean protein, and low-fat dairy products. Take vitamin and mineral supplements as recommended by your health care provider. Do not drink alcohol if: Your health care provider tells you not to drink. You are pregnant, may be pregnant, or are planning to become pregnant. If you drink alcohol: Limit how much you have to 0-1 drink a day. Know how much alcohol is in your drink. In the [place] one drink equals one 12 oz bottle of beer (355 mL), one 5 oz glass of wine (148 mL), or one 1 oz glass of hard liquor (44 mL). Lifestyle Brush your teeth every morning and night with fluoride toothpaste. Floss one time each day. Exercise for at least 30 minutes 5 or more days each week. Do not use any products that contain nicotine or tobacco. These products include cigarettes, chewing tobacco, and vaping devices, such as e-cigarettes. If you need help quitting, ask your health care provider. Do not use drugs. If you are sexually active,  practice safe sex. Use a condom or other form of protection to prevent STIs. If you do not wish to become pregnant, use a form of birth  control. If you plan to become pregnant, see your health care provider for a prepregnancy visit. Take aspirin only as told by your health care provider. Make sure that you understand how much to take and what form to take. Work with your health care provider to find out whether it is safe and beneficial for you to take aspirin daily. Find healthy ways to manage stress, such as: Meditation, yoga, or listening to music. Journaling. Talking to a trusted person. Spending time with friends and family. Minimize exposure to UV radiation to reduce your risk of skin cancer. Safety Always wear your seat belt while driving or riding in a vehicle. Do not drive: If you have been drinking alcohol. Do not ride with someone who has been drinking. When you are tired or distracted. While texting. If you have been using any mind-altering substances or drugs. Wear a helmet and other protective equipment during sports activities. If you have firearms in your house, make sure you follow all gun safety procedures. Seek help if you have been physically or sexually abused. What's next? Visit your health care provider once a year for an annual wellness visit. Ask your health care provider how often you should have your eyes and teeth checked. Stay up to date on all vaccines. This information is not intended to replace advice given to you by your health care provider. Make sure you discuss any questions you have with your health care provider. Document Revised: 07/16/2020 Document Reviewed: 07/16/2020 Elsevier Patient Education  2023 [employer].

## 2022-05-17 NOTE — Assessment & Plan Note (Signed)
hgba1c acceptable, minimize simple carbs. Increase exercise as tolerated.  

## 2022-05-17 NOTE — Progress Notes (Signed)
Subjective:    Patient ID: Cynthia Blanchard, female    DOB: [birth year], 47 y.o.   MRN: [id number]  Chief Complaint  Patient presents with   Annual Exam    Annual Exam    HPI Patient is in today for follow up on chronic medical concerns and annual preventative exam. No recent febrile illness or hospitalizations. Denies palp/SOB/HA/congestion/fevers or GU c/o. Taking meds as prescribed. She reports taking the sertraline 1/2 the month and it has been very helpful. She feels less anxious. Hot flashes are better. She is using low dose Alprazolam and sleeping better. She is eating well and staying active.  She notes loose stool at times and a bit of sharp substernal cp which she links to coughing with her URI last month. Is improving and has no associated symptoms  Past Medical History:  Diagnosis Date   Abdominal pain 09/15/2014   Anxiety and depression 06/01/2016   Asthma    as a child   Cervical cancer screening 09/03/2014   Chicken pox as a child   Diabetes mellitus without complication 2010   gestational diabetes- first pregnancy- not in the last 2 pregnancies   Dysuria 12/09/2013   Fatigue 11/28/2015   Grief reaction 11/28/2015   Hyperlipidemia 12/09/2013   Insomnia 12/09/2013   Preventative health care 09/03/2014   Spondylolisthesis 12/09/2013   Sun-damaged skin 09/15/2014   Throat irritation 12/09/2013    Past Surgical History:  Procedure Laterality Date   DILATION AND CURETTAGE OF UTERUS  2012   FOOT SURGERY  8-10   right- cyst- size of a golf ball   tube removed  2012   right tube   tube ressected  2009   right tube    Family History  Problem Relation Age of Onset   Hypertension Mother    Obesity Father    Depression Father    Mental illness Brother    HIV Brother    Drug abuse Brother    Kidney disease Brother    Other Brother        Glomerular Nephritis   Cancer Maternal Grandmother        skin   Other Maternal Grandmother        valve replacement   Cancer Maternal  Grandfather        colon   Diabetes Paternal Grandmother        type 2   Stroke Paternal Grandmother    Cancer Paternal Grandfather        prostate    Social History   Socioeconomic History   Marital status: Married    Spouse name: Not on file   Number of children: Not on file   Years of education: Not on file   Highest education level: Not on file  Occupational History   Not on file  Tobacco Use   Smoking status: Never   Smokeless tobacco: Never  Substance and Sexual Activity   Alcohol use: Yes    Alcohol/week: 0.0 standard drinks of alcohol    Comment: glass of wine every night   Drug use: No   Sexual activity: Yes    Partners: Male    Comment: live with husband and 3 children. works prn for Physical therapy, no dietarty restirctions  Other Topics Concern   Not on file  Social History Narrative   Works as a Adult nurse   Social Determinants of Corporate investment banker Strain: Not on file  Food Insecurity: Not on Chartered certified accountant  Needs: Not on file  Physical Activity: Not on file  Stress: Not on file  Social Connections: Not on file  Intimate Partner Violence: Not on file    Outpatient Medications Prior to Visit  Medication Sig Dispense Refill   albuterol (VENTOLIN HFA) 108 (90 Base) MCG/ACT inhaler Inhale 2 puffs into the lungs every 6 (six) hours as needed for wheezing or shortness of breath. 1 each 2   ALPRAZolam (XANAX) 0.5 MG tablet Take 1 tablet (0.5 mg total) by mouth 2 (two) times daily. 30 tablet 0   hyoscyamine (LEVSIN SL) 0.125 MG SL tablet Place 1 tablet (0.125 mg total) under the tongue every 6 (six) hours as needed. 30 tablet 1   sertraline (ZOLOFT) 50 MG tablet Take 1 tablet (50 mg total) by mouth daily. 90 tablet 1   doxycycline (VIBRAMYCIN) 100 MG capsule Take 1 capsule (100 mg total) by mouth 2 (two) times daily. 20 capsule 0   promethazine-dextromethorphan (PROMETHAZINE-DM) 6.25-15 MG/5ML syrup Take 5 mLs by mouth 4 (four) times  daily as needed for cough. 118 mL 0   No facility-administered medications prior to visit.    Allergies  Allergen Reactions   Latex Hives   Penicillins Hives    Review of Systems  Constitutional:  Negative for chills, fever and malaise/fatigue.  HENT:  Negative for congestion and hearing loss.   Eyes:  Negative for blurred vision and discharge.  Respiratory:  Negative for cough, sputum production and shortness of breath.   Cardiovascular:  Positive for chest pain. Negative for palpitations and leg swelling.  Gastrointestinal:  Positive for diarrhea. Negative for abdominal pain, blood in stool, constipation, heartburn, nausea and vomiting.  Genitourinary:  Negative for dysuria, frequency, hematuria and urgency.  Musculoskeletal:  Negative for back pain, falls and myalgias.  Skin:  Negative for rash.  Neurological:  Negative for dizziness, sensory change, loss of consciousness, weakness and headaches.  Endo/Heme/Allergies:  Negative for environmental allergies. Does not bruise/bleed easily.  Psychiatric/Behavioral:  Negative for depression and suicidal ideas. The patient is nervous/anxious and has insomnia.        Objective:    Physical Exam Constitutional:      General: She is not in acute distress.    Appearance: Normal appearance. She is not diaphoretic.  HENT:     Head: Normocephalic and atraumatic.     Right Ear: Tympanic membrane, ear canal and external ear normal.     Left Ear: Tympanic membrane, ear canal and external ear normal.     Nose: Nose normal.     Mouth/Throat:     Mouth: Mucous membranes are moist.     Pharynx: Oropharynx is clear. No oropharyngeal exudate.  Eyes:     General: No scleral icterus.       Right eye: No discharge.        Left eye: No discharge.     Conjunctiva/sclera: Conjunctivae normal.     Pupils: Pupils are equal, round, and reactive to light.  Neck:     Thyroid: No thyromegaly.  Cardiovascular:     Rate and Rhythm: Normal rate and  regular rhythm.     Heart sounds: Normal heart sounds. No murmur heard. Pulmonary:     Effort: Pulmonary effort is normal. No respiratory distress.     Breath sounds: Normal breath sounds. No wheezing or rales.  Abdominal:     General: Bowel sounds are normal. There is no distension.     Palpations: Abdomen is soft. There is no mass.  Tenderness: There is no abdominal tenderness.  Musculoskeletal:        General: No tenderness. Normal range of motion.     Cervical back: Normal range of motion and neck supple.  Lymphadenopathy:     Cervical: No cervical adenopathy.  Skin:    General: Skin is warm and dry.     Findings: No rash.  Neurological:     General: No focal deficit present.     Mental Status: She is alert and oriented to person, place, and time.     Cranial Nerves: No cranial nerve deficit.     Coordination: Coordination normal.     Deep Tendon Reflexes: Reflexes are normal and symmetric. Reflexes normal.  Psychiatric:        Mood and Affect: Mood normal.        Behavior: Behavior normal.        Thought Content: Thought content normal.        Judgment: Judgment normal.     BP 105/64 (BP Location: Right Arm, Patient Position: Sitting, Cuff Size: Normal)   Pulse 66   Temp 98 F (36.7 C) (Oral)   Resp 16   Ht  (1.702 m)   Wt 137 lb (62.1 kg)   SpO2 99%   BMI 21.46 kg/m  Wt Readings from Last 3 Encounters:  05/17/22 137 lb (62.1 kg)  11/02/21 130 lb (59 kg)  04/17/21 132 lb (59.9 kg)    Diabetic Foot Exam - Simple   No data filed    Lab Results  Component Value Date   WBC 5.3 11/09/2021   HGB 12.6 11/09/2021   HCT 37.9 11/09/2021   PLT 244.0 11/09/2021   GLUCOSE 116 (H) 11/09/2021   CHOL 159 11/09/2021   TRIG 49.0 11/09/2021   HDL 83.70 11/09/2021   LDLCALC 65 11/09/2021   ALT 15 11/09/2021   AST 21 11/09/2021   NA 140 11/09/2021   K 3.8 11/09/2021   CL 103 11/09/2021   CREATININE 0.63 11/09/2021   BUN 12 11/09/2021   CO2 27 11/09/2021    TSH 1.25 11/09/2021   HGBA1C 5.5 11/10/2021    Lab Results  Component Value Date   TSH 1.25 11/09/2021   Lab Results  Component Value Date   WBC 5.3 11/09/2021   HGB 12.6 11/09/2021   HCT 37.9 11/09/2021   MCV 96.3 11/09/2021   PLT 244.0 11/09/2021   Lab Results  Component Value Date   NA 140 11/09/2021   K 3.8 11/09/2021   CO2 27 11/09/2021   GLUCOSE 116 (H) 11/09/2021   BUN 12 11/09/2021   CREATININE 0.63 11/09/2021   BILITOT 0.8 11/09/2021   ALKPHOS 52 11/09/2021   AST 21 11/09/2021   ALT 15 11/09/2021   PROT 6.6 11/09/2021   ALBUMIN 4.2 11/09/2021   CALCIUM 9.1 11/09/2021   GFR 106.93 11/09/2021   Lab Results  Component Value Date   CHOL 159 11/09/2021   Lab Results  Component Value Date   HDL 83.70 11/09/2021   Lab Results  Component Value Date   LDLCALC 65 11/09/2021   Lab Results  Component Value Date   TRIG 49.0 11/09/2021   Lab Results  Component Value Date   CHOLHDL 2 11/09/2021   Lab Results  Component Value Date   HGBA1C 5.5 11/10/2021       Assessment & Plan:  Preventative health care Assessment & Plan: Patient encouraged to maintain heart healthy diet, regular exercise, adequate sleep. Consider daily probiotics.  Take medications as prescribed. Labs ordered and reviewed. Given and reviewed copy of ACP documents from [employer] and encouraged to complete and return. MGM April 2023 repeat this year.  Colonoscopy is due Pap March 2023 repeat in 2026-28    Other hyperlipidemia Assessment & Plan: Encourage heart healthy diet such as MIND or DASH diet, increase exercise, avoid trans fats, simple carbohydrates and processed foods, consider a krill or fish or flaxseed oil cap daily.     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 She feels much more settled and less anxious. Will continue current dose   High risk medication use -     Drug Monitoring Panel [id number] ,  Urine  Hyperglycemia Assessment & Plan: hgba1c acceptable, minimize simple carbs. Increase exercise as tolerated.   Orders: -     Comprehensive metabolic panel -     Hemoglobin A1c  Diarrhea, unspecified type Assessment & Plan: She notes more loose stool when taking the sertraline, she should add a fiber supplement and if it persists can consider referral to GI for consideration  Orders: -     CBC with Differential/Platelet -     TSH  Encounter for screening for malignant neoplasm of breast, unspecified screening modality -     3D Screening Mammogram, Left and Right; Future  PMDD (premenstrual dysphoric disorder) Assessment & Plan: Using Sertraline roughly 1/2 of the month with good results   Insomnia, unspecified type Assessment & Plan: Encouraged good sleep hygiene such as dark, quiet room. No blue/green glowing lights such as computer screens in bedroom. No alcohol or stimulants in evening. Cut down on caffeine as able. Regular exercise is helpful but not just prior to bed time.  Uses 1/2 of an Alprazolam prn for sleep with good results     Danise Edge, MD

## 2022-05-17 NOTE — Assessment & Plan Note (Signed)
She notes more loose stool when taking the sertraline, she should add a fiber supplement and if it persists can consider referral to GI for consideration

## 2022-05-17 NOTE — Assessment & Plan Note (Signed)
Encouraged good sleep hygiene such as dark, quiet room. No blue/green glowing lights such as computer screens in bedroom. No alcohol or stimulants in evening. Cut down on caffeine as able. Regular exercise is helpful but not just prior to bed time.  Uses 1/2 of an Alprazolam prn for sleep with good results

## 2022-05-18 ENCOUNTER — Other Ambulatory Visit: Payer: Self-pay

## 2022-05-18 DIAGNOSIS — R739 Hyperglycemia, unspecified: Secondary | ICD-10-CM

## 2022-05-18 LAB — HEMOGLOBIN A1C: Hgb A1c MFr Bld: 5.2 % (ref 4.6–6.5)

## 2022-05-18 LAB — COMPREHENSIVE METABOLIC PANEL WITH GFR
ALT: 20 U/L (ref 0–35)
AST: 27 U/L (ref 0–37)
Albumin: 4.4 g/dL (ref 3.5–5.2)
Alkaline Phosphatase: 51 U/L (ref 39–117)
BUN: 14 mg/dL (ref 6–23)
CO2: 28 meq/L (ref 19–32)
Calcium: 9 mg/dL (ref 8.4–10.5)
Chloride: 102 meq/L (ref 96–112)
Creatinine, Ser: 0.7 mg/dL (ref 0.40–1.20)
GFR: 103.87 mL/min
Glucose, Bld: 97 mg/dL (ref 70–99)
Potassium: 3.4 meq/L — ABNORMAL LOW (ref 3.5–5.1)
Sodium: 138 meq/L (ref 135–145)
Total Bilirubin: 0.6 mg/dL (ref 0.2–1.2)
Total Protein: 6.7 g/dL (ref 6.0–8.3)

## 2022-05-18 LAB — CBC WITH DIFFERENTIAL/PLATELET
Basophils Absolute: 0 10*3/uL (ref 0.0–0.1)
Basophils Relative: 0.8 % (ref 0.0–3.0)
Eosinophils Absolute: 0.1 10*3/uL (ref 0.0–0.7)
Eosinophils Relative: 1.5 % (ref 0.0–5.0)
HCT: 37.3 % (ref 36.0–46.0)
Hemoglobin: 12.5 g/dL (ref 12.0–15.0)
Lymphocytes Relative: 28.7 % (ref 12.0–46.0)
Lymphs Abs: 1.7 10*3/uL (ref 0.7–4.0)
MCHC: 33.6 g/dL (ref 30.0–36.0)
MCV: 96.3 fl (ref 78.0–100.0)
Monocytes Absolute: 0.4 10*3/uL (ref 0.1–1.0)
Monocytes Relative: 7.3 % (ref 3.0–12.0)
Neutro Abs: 3.7 10*3/uL (ref 1.4–7.7)
Neutrophils Relative %: 61.7 % (ref 43.0–77.0)
Platelets: 301 10*3/uL (ref 150.0–400.0)
RBC: 3.87 Mil/uL (ref 3.87–5.11)
RDW: 14.3 % (ref 11.5–15.5)
WBC: 6 10*3/uL (ref 4.0–10.5)

## 2022-05-18 LAB — TSH: TSH: 0.95 u[IU]/mL (ref 0.35–5.50)

## 2022-05-18 LAB — DM TEMPLATE

## 2022-05-20 LAB — DRUG MONITORING PANEL 376104, URINE
Alphahydroxyalprazolam: 36 ng/mL — ABNORMAL HIGH (ref <25)
Alphahydroxymidazolam: NEGATIVE ng/mL (ref <50)
Alphahydroxytriazolam: NEGATIVE ng/mL (ref <50)
Aminoclonazepam: NEGATIVE ng/mL (ref <25)
Amphetamine: NEGATIVE ng/mL (ref <250)
Amphetamines: NEGATIVE ng/mL (ref <500)
Barbiturates: NEGATIVE ng/mL (ref <300)
Benzodiazepines: POSITIVE ng/mL — AB (ref <100)
Cocaine Metabolite: NEGATIVE ng/mL (ref <150)
Desmethyltramadol: NEGATIVE ng/mL (ref <100)
Hydroxyethylflurazepam: NEGATIVE ng/mL (ref <50)
Lorazepam: NEGATIVE ng/mL (ref <50)
Methamphetamine: NEGATIVE ng/mL (ref <250)
Nordiazepam: NEGATIVE ng/mL (ref <50)
Opiates: NEGATIVE ng/mL (ref <100)
Oxazepam: NEGATIVE ng/mL (ref <50)
Oxycodone: NEGATIVE ng/mL (ref <100)
Temazepam: NEGATIVE ng/mL (ref <50)
Tramadol: NEGATIVE ng/mL (ref <100)

## 2022-07-01 ENCOUNTER — Other Ambulatory Visit: Payer: Self-pay | Admitting: Family Medicine

## 2022-07-01 MED ORDER — ALPRAZOLAM 0.5 MG PO TABS: 0.5000 mg | ORAL_TABLET | Freq: Two times a day (BID) | ORAL | 0 refills | Status: DC

## 2022-07-01 NOTE — Telephone Encounter (Signed)
Requesting: ALPRAZolam (XANAX) 0.5 MG tablet Take 1 tablet (0.5 mg total) by mouth 2 (two) times daily.  Contract: 06/03/2017 UDS: 05/17/2022 Last Visit: 05/17/2022 Next Visit: 11/18/2022 Last Refill: 04/26/2022 #30 with no refills  Please Advise

## 2022-07-10 ENCOUNTER — Other Ambulatory Visit: Payer: Self-pay | Admitting: Family Medicine

## 2022-08-30 ENCOUNTER — Other Ambulatory Visit: Payer: Self-pay | Admitting: Family Medicine

## 2022-08-31 MED ORDER — ALPRAZOLAM 0.5 MG PO TABS: 0.5000 mg | ORAL_TABLET | Freq: Two times a day (BID) | ORAL | 0 refills | Status: DC

## 2022-10-11 ENCOUNTER — Ambulatory Visit (HOSPITAL_BASED_OUTPATIENT_CLINIC_OR_DEPARTMENT_OTHER)
Admission: RE | Admit: 2022-10-11 | Discharge: 2022-10-11 | Disposition: A | Discharge status: Home / Self Care | Payer: 59 | Source: Ambulatory Visit | Attending: Family Medicine | Admitting: Family Medicine

## 2022-10-11 ENCOUNTER — Encounter (HOSPITAL_BASED_OUTPATIENT_CLINIC_OR_DEPARTMENT_OTHER): Payer: Self-pay

## 2022-10-11 DIAGNOSIS — Z1231 Encounter for screening mammogram for malignant neoplasm of breast: Secondary | ICD-10-CM | POA: Insufficient documentation

## 2022-10-11 DIAGNOSIS — Z1239 Encounter for other screening for malignant neoplasm of breast: Secondary | ICD-10-CM | POA: Diagnosis present

## 2022-10-13 ENCOUNTER — Other Ambulatory Visit: Payer: Self-pay | Admitting: Family Medicine

## 2022-10-13 DIAGNOSIS — R928 Other abnormal and inconclusive findings on diagnostic imaging of breast: Secondary | ICD-10-CM

## 2022-10-20 ENCOUNTER — Ambulatory Visit: Payer: 59

## 2022-10-20 ENCOUNTER — Ambulatory Visit
Admission: RE | Admit: 2022-10-20 | Discharge: 2022-10-20 | Disposition: A | Discharge status: Home / Self Care | Payer: 59 | Source: Ambulatory Visit | Attending: Family Medicine | Admitting: Family Medicine

## 2022-10-20 DIAGNOSIS — R928 Other abnormal and inconclusive findings on diagnostic imaging of breast: Secondary | ICD-10-CM

## 2022-11-18 ENCOUNTER — Ambulatory Visit: Payer: 59 | Admitting: Family Medicine

## 2022-11-19 ENCOUNTER — Other Ambulatory Visit: Payer: Self-pay | Admitting: Family Medicine

## 2022-11-19 MED ORDER — ALPRAZOLAM 0.5 MG PO TABS: 0.5000 mg | ORAL_TABLET | Freq: Two times a day (BID) | ORAL | 0 refills | Status: DC

## 2022-11-19 NOTE — Telephone Encounter (Signed)
Requesting: alprazolam 0.5mg   Contract: 05/17/22 UDS: 05/17/22 Last Visit: 05/17/22 Next Visit: 12/07/22 Last Refill: 08/31/22 #30 and 0RF   Please Advise

## 2022-12-06 NOTE — Assessment & Plan Note (Signed)
Has improved with Sertraline

## 2022-12-06 NOTE — Assessment & Plan Note (Signed)
hgba1c acceptable, minimize simple carbs. Increase exercise as tolerated.  

## 2022-12-06 NOTE — Assessment & Plan Note (Signed)
Encourage heart healthy diet such as MIND or DASH diet, increase exercise, avoid trans fats, simple carbohydrates and processed foods, consider a krill or fish or flaxseed oil cap daily.  °

## 2022-12-07 ENCOUNTER — Encounter: Payer: Self-pay | Admitting: Family Medicine

## 2022-12-07 ENCOUNTER — Ambulatory Visit (HOSPITAL_BASED_OUTPATIENT_CLINIC_OR_DEPARTMENT_OTHER)
Admission: RE | Admit: 2022-12-07 | Discharge: 2022-12-07 | Disposition: A | Discharge status: Home / Self Care | Payer: 59 | Source: Ambulatory Visit | Attending: Family Medicine | Admitting: Family Medicine

## 2022-12-07 ENCOUNTER — Ambulatory Visit (INDEPENDENT_AMBULATORY_CARE_PROVIDER_SITE_OTHER): Payer: 59 | Admitting: Family Medicine

## 2022-12-07 VITALS — BP 118/70 | HR 70 | Temp 98.4°F | Ht 67.0 in | Wt 134.2 lb

## 2022-12-07 DIAGNOSIS — F3281 Premenstrual dysphoric disorder: Secondary | ICD-10-CM | POA: Diagnosis not present

## 2022-12-07 DIAGNOSIS — E7849 Other hyperlipidemia: Secondary | ICD-10-CM | POA: Diagnosis not present

## 2022-12-07 DIAGNOSIS — M542 Cervicalgia: Secondary | ICD-10-CM

## 2022-12-07 DIAGNOSIS — R739 Hyperglycemia, unspecified: Secondary | ICD-10-CM | POA: Diagnosis not present

## 2022-12-07 DIAGNOSIS — F419 Anxiety disorder, unspecified: Secondary | ICD-10-CM

## 2022-12-07 DIAGNOSIS — R109 Unspecified abdominal pain: Secondary | ICD-10-CM

## 2022-12-07 DIAGNOSIS — E876 Hypokalemia: Secondary | ICD-10-CM | POA: Diagnosis not present

## 2022-12-07 LAB — COMPREHENSIVE METABOLIC PANEL
ALT: 15 U/L (ref 0–35)
AST: 23 U/L (ref 0–37)
Albumin: 4.6 g/dL (ref 3.5–5.2)
Alkaline Phosphatase: 49 U/L (ref 39–117)
BUN: 12 mg/dL (ref 6–23)
CO2: 28 meq/L (ref 19–32)
Calcium: 9.8 mg/dL (ref 8.4–10.5)
Chloride: 103 meq/L (ref 96–112)
Creatinine, Ser: 0.61 mg/dL (ref 0.40–1.20)
GFR: 106.95 mL/min (ref >60.00)
Glucose, Bld: 84 mg/dL (ref 70–99)
Potassium: 4 meq/L (ref 3.5–5.1)
Sodium: 139 meq/L (ref 135–145)
Total Bilirubin: 0.8 mg/dL (ref 0.2–1.2)
Total Protein: 7.2 g/dL (ref 6.0–8.3)

## 2022-12-07 MED ORDER — SERTRALINE HCL 50 MG PO TABS: 50.0000 mg | ORAL_TABLET | Freq: Every day | ORAL | 1 refills | Status: DC

## 2022-12-07 MED ORDER — ALPRAZOLAM 0.5 MG PO TABS: 0.5000 mg | ORAL_TABLET | Freq: Two times a day (BID) | ORAL | 1 refills | Status: DC | PRN

## 2022-12-07 MED ORDER — TIZANIDINE HCL 2 MG PO TABS: 1.0000 mg | ORAL_TABLET | Freq: Three times a day (TID) | ORAL | 1 refills | Status: AC | PRN

## 2022-12-07 NOTE — Assessment & Plan Note (Signed)
Is much better on the Zoloft. At first she had diarrhea but that is better now.

## 2022-12-07 NOTE — Progress Notes (Signed)
Subjective:    Patient ID: Cynthia Blanchard, female    DOB: 19-Jul-1975, 47 y.o.   MRN: [id number]  Chief Complaint  Patient presents with   Follow-up    HPI Discussed the use of AI scribe software for clinical note transcription with the patient, who gave verbal consent to proceed.  History of Present Illness   The patient, with a history of anxiety and depression managed with Xanax and Zoloft, presents for a routine follow-up. She reports that the medications are working well in managing her symptoms. The patient also mentions a previous issue with diarrhea, which she believes was related to the Zoloft. However, she reports that the diarrhea has significantly improved and is no longer a concern.  The patient also reports experiencing lightheadedness when assuming a plank position during workouts. She describes it as feeling like her blood is not reaching her head. This symptom is new and specific to this position. Passes quickly when she changes positions.  In addition, the patient has been experiencing severe neck pain for the past eight months. 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 The patient has been taking ibuprofen every six hours for relief and has tried meloxicam. She also reports some tingling in her hands, which is positional and usually occurs while driving or holding things up.        Past Medical History:  Diagnosis Date   Abdominal pain 09/15/2014   Anxiety and depression 06/01/2016   Asthma    as a child   Cervical cancer screening 09/03/2014   Chicken pox as a child   Diabetes mellitus without complication (HCC) 2010   gestational diabetes- first pregnancy- not in the last 2 pregnancies   Dysuria 12/09/2013   Fatigue 11/28/2015   Grief reaction 11/28/2015   Hyperlipidemia 12/09/2013   Insomnia 12/09/2013   Preventative health care 09/03/2014   Spondylolisthesis 12/09/2013    Sun-damaged skin 09/15/2014   Throat irritation 12/09/2013    Past Surgical History:  Procedure Laterality Date   DILATION AND CURETTAGE OF UTERUS  2012   FOOT SURGERY  8-10   right- cyst- size of a golf ball   tube removed  2012   right tube   tube ressected  2009   right tube    Family History  Problem Relation Age of Onset   Hypertension Mother    Obesity Father    Depression Father    Mental illness Brother    HIV Brother    Drug abuse Brother    Kidney disease Brother    Other Brother        Glomerular Nephritis   Cancer Maternal Grandmother        skin   Other Maternal Grandmother        valve replacement   Cancer Maternal Grandfather        colon   Diabetes Paternal Grandmother        type 2   Stroke Paternal Grandmother    Cancer Paternal Grandfather        prostate    Social History   Socioeconomic History   Marital status: Married    Spouse name: Not on file   Number of children: Not on file   Years of education: Not on file   Highest education level: Master's degree (e.g., MA, MS, MEng, MEd, MSW, MBA)  Occupational History   Not on file  Tobacco Use  Smoking status: Never   Smokeless tobacco: Never  Substance and Sexual Activity   Alcohol use: Yes    Alcohol/week: 0.0 standard drinks of alcohol    Comment: glass of wine every night   Drug use: No   Sexual activity: Yes    Partners: Male    Comment: live with husband and 3 children. works prn for Physical therapy, no dietarty restirctions  Other Topics Concern   Not on file  Social History Narrative   Works as a Adult nurse   Social Determinants of Health   Financial Resource Strain: Low Risk  (12/06/2022)   Overall Financial Resource Strain (CARDIA)    Difficulty of Paying Living Expenses: Not very hard  Food Insecurity: No Food Insecurity (12/06/2022)   Hunger Vital Sign    Worried About Running Out of Food in the Last Year: Never true    Ran Out of Food in the Last Year: Never  true  Transportation Needs: No Transportation Needs (12/06/2022)   PRAPARE - Administrator, Civil Service (Medical): No    Lack of Transportation (Non-Medical): No  Physical Activity: Sufficiently Active (12/06/2022)   Exercise Vital Sign    Days of Exercise per Week: 3 days    Minutes of Exercise per Session: 60 min  Stress: Stress Concern Present (12/06/2022)   [employer] of Occupational Health - Occupational Stress Questionnaire    Feeling of Stress : Rather much  Social Connections: Moderately Isolated (12/06/2022)   Social Connection and Isolation Panel [NHANES]    Frequency of Communication with Friends and Family: More than three times a week    Frequency of Social Gatherings with Friends and Family: Once a week    Attends Religious Services: Never    Database administrator or Organizations: No    Attends Engineer, structural: Not on file    Marital Status: Married  Catering manager Violence: Not on file    Outpatient Medications Prior to Visit  Medication Sig Dispense Refill   hyoscyamine (LEVSIN SL) 0.125 MG SL tablet Place 1 tablet (0.125 mg total) under the tongue every 6 (six) hours as needed. 30 tablet 1   ALPRAZolam (XANAX) 0.5 MG tablet Take 1 tablet (0.5 mg total) by mouth 2 (two) times daily. 30 tablet 0   sertraline (ZOLOFT) 50 MG tablet TAKE 1 TABLET BY MOUTH EVERY DAY 90 tablet 1   albuterol (VENTOLIN HFA) 108 (90 Base) MCG/ACT inhaler Inhale 2 puffs into the lungs every 6 (six) hours as needed for wheezing or shortness of breath. 1 each 2   No facility-administered medications prior to visit.    Allergies  Allergen Reactions   Latex Hives   Penicillins Hives    Review of Systems  Constitutional:  Negative for fever and malaise/fatigue.  HENT:  Negative for congestion.   Eyes:  Negative for blurred vision.  Respiratory:  Negative for shortness of breath.   Cardiovascular:  Negative for chest pain, palpitations and leg swelling.   Gastrointestinal:  Negative for abdominal pain, blood in stool and nausea.  Genitourinary:  Negative for dysuria and frequency.  Musculoskeletal:  Positive for neck pain. Negative for falls.  Skin:  Negative for rash.  Neurological:  Negative for dizziness, loss of consciousness and headaches.  Endo/Heme/Allergies:  Negative for environmental allergies.  Psychiatric/Behavioral:  Negative for depression. The patient is not nervous/anxious.        Objective:    Physical Exam Constitutional:      General:  She is not in acute distress.    Appearance: Normal appearance. She is well-developed. She is not toxic-appearing.  HENT:     Head: Normocephalic and atraumatic.     Right Ear: External ear normal.     Left Ear: External ear normal.     Nose: Nose normal.  Eyes:     General:        Right eye: No discharge.        Left eye: No discharge.     Conjunctiva/sclera: Conjunctivae normal.  Neck:     Thyroid: No thyromegaly.  Cardiovascular:     Rate and Rhythm: Normal rate and regular rhythm.     Heart sounds: Normal heart sounds. No murmur heard. Pulmonary:     Effort: Pulmonary effort is normal. No respiratory distress.     Breath sounds: Normal breath sounds.  Abdominal:     General: Bowel sounds are normal.     Palpations: Abdomen is soft.     Tenderness: There is no abdominal tenderness. There is no guarding.  Musculoskeletal:        General: Normal range of motion.     Cervical back: Neck supple.  Lymphadenopathy:     Cervical: No cervical adenopathy.  Skin:    General: Skin is warm and dry.  Neurological:     Mental Status: She is alert and oriented to person, place, and time.  Psychiatric:        Mood and Affect: Mood normal.        Behavior: Behavior normal.        Thought Content: Thought content normal.        Judgment: Judgment normal.     BP 118/70 (BP Location: Left Arm, Patient Position: Sitting, Cuff Size: Normal)   Pulse 70   Temp 98.4 F (36.9 C)  (Oral)   Ht 5\' 7"  (1.702 m)   Wt 134 lb 3.2 oz (60.9 kg)   SpO2 99%   BMI 21.02 kg/m  Wt Readings from Last 3 Encounters:  12/07/22 134 lb 3.2 oz (60.9 kg)  05/17/22 137 lb (62.1 kg)  11/02/21 130 lb (59 kg)    Diabetic Foot Exam - Simple   No data filed    Lab Results  Component Value Date   WBC 6.0 05/17/2022   HGB 12.5 05/17/2022   HCT 37.3 05/17/2022   PLT 301.0 05/17/2022   GLUCOSE 84 12/07/2022   CHOL 159 11/09/2021   TRIG 49.0 11/09/2021   HDL 83.70 11/09/2021   LDLCALC 65 11/09/2021   ALT 15 12/07/2022   AST 23 12/07/2022   NA 139 12/07/2022   K 4.0 12/07/2022   CL 103 12/07/2022   CREATININE 0.61 12/07/2022   BUN 12 12/07/2022   CO2 28 12/07/2022   TSH 0.95 05/17/2022   HGBA1C 5.2 05/17/2022    Lab Results  Component Value Date   TSH 0.95 05/17/2022   Lab Results  Component Value Date   WBC 6.0 05/17/2022   HGB 12.5 05/17/2022   HCT 37.3 05/17/2022   MCV 96.3 05/17/2022   PLT 301.0 05/17/2022   Lab Results  Component Value Date   NA 139 12/07/2022   K 4.0 12/07/2022   CO2 28 12/07/2022   GLUCOSE 84 12/07/2022   BUN 12 12/07/2022   CREATININE 0.61 12/07/2022   BILITOT 0.8 12/07/2022   ALKPHOS 49 12/07/2022   AST 23 12/07/2022   ALT 15 12/07/2022   PROT 7.2 12/07/2022   ALBUMIN 4.6 12/07/2022  CALCIUM 9.8 12/07/2022   GFR 106.95 12/07/2022   Lab Results  Component Value Date   CHOL 159 11/09/2021   Lab Results  Component Value Date   HDL 83.70 11/09/2021   Lab Results  Component Value Date   LDLCALC 65 11/09/2021   Lab Results  Component Value Date   TRIG 49.0 11/09/2021   Lab Results  Component Value Date   CHOLHDL 2 11/09/2021   Lab Results  Component Value Date   HGBA1C 5.2 05/17/2022       Assessment & Plan:  Hyperglycemia Assessment & Plan: hgba1c acceptable, minimize simple carbs. Increase exercise as tolerated.    Other hyperlipidemia Assessment & Plan: Encourage heart healthy diet such as MIND or  DASH diet, increase exercise, avoid trans fats, simple carbohydrates and processed foods, consider a krill or fish or flaxseed oil cap daily.     PMDD (premenstrual dysphoric disorder) Assessment & Plan: Has improved with Sertraline   Hypokalemia -     Comprehensive metabolic panel  Abdominal pain, unspecified abdominal location Assessment & Plan: Is much better on the Zoloft. At first she had diarrhea but that is better now.   Neck pain Assessment & Plan: Worsening over time. 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 Encouraged moist heat and gentle stretching as tolerated. May try NSAIDs and prescription meds as directed and report if symptoms worsen or seek immediate care   Orders: -     DG Cervical Spine Complete; Future  Anxiety Assessment & Plan: Zoloft at 50 mg is helping. Lower does not help. Can continue with Alprazolam    Other orders -     tiZANidine HCl; Take 0.5-2 tablets (1-4 mg total) by mouth every 8 (eight) hours as needed for muscle spasms.  Dispense: 40 tablet; Refill: 1 -     ALPRAZolam; Take 1 tablet (0.5 mg total) by mouth 2 (two) times daily as needed for anxiety.  Dispense: 30 tablet; Refill: 1 -     Sertraline HCl; Take 1 tablet (50 mg total) by mouth daily.  Dispense: 90 tablet; Refill: 1    Assessment and Plan    Anxiety and Depression Managed with Zoloft and Xanax. Recent history of inconsistent Zoloft use due to diarrhea, which has since resolved. Patient reports mood instability when not on Zoloft. -Continue Zoloft and Xanax as prescribed. -Add a daily fiber supplement to manage potential future diarrhea. -Check in 6 months or sooner if issues arise.  Hypokalemia Previous low potassium level (3.4) likely due to diarrhea. Recent improvement in diarrhea symptoms. Patient reported palpitations and muscle  symptoms, which could be related to low potassium. -Check potassium level today to ensure it has normalized.  Neck Pain Chronic neck pain worsening over the past few weeks. Currently managed with ibuprofen, but pain is persistent. -Order neck x-ray. -Prescribe Tizanidine 2mg  for muscle relaxation, can increase to 4mg  at bedtime if tolerated. -Advise on heat application, stretching, and use of topical analgesics. -If pain persists or x-ray shows significant findings, consider MRI.  Lightheadedness New symptom of lightheadedness, particularly when changing positions. No clear cause identified at this time. -Advise on increased hydration and regular protein intake. -If symptoms persist, consider further workup including carotid dopplers and MRA.  General Health Maintenance -Flu shot already administered. -Follow-up in 6 months.         Danise Edge, MD

## 2022-12-07 NOTE — Patient Instructions (Addendum)
Metamucil/Psyllium or benefiber once to twice daily   Vertigo Vertigo is the feeling that you or the things around you are moving or spinning when they're not. It's different than feeling dizzy. It can also cause: Loss of balance. Trouble standing or walking. Nausea and vomiting. This feeling can come and go at any time. It can last from a few seconds to minutes or even hours. It may go away on its own or be treated with medicine. What are the types of vertigo? There are two types of vertigo: Peripheral vertigo happens when parts of your inner ear don't work like they should. This is the more common type. Central vertigo happens when your brain and spinal cord don't work like they should. Your health care provider will do tests to find out what kind of vertigo you have. This will help them decide on the right treatment for you. Follow these instructions at home: Eating and drinking Drink enough fluid to keep your pee (urine) pale yellow. Do not drink alcohol. Activity When you get up in the morning, first sit up on the side of the bed. When you feel okay, stand slowly while holding onto something. Move slowly. Avoid sudden body or head movements. Avoid certain positions, as told by your provider. Use a cane if you have trouble standing or walking. Sit down right away if you feel unsteady. Place items in your home so they're easy for you to reach without bending or leaning over. Return to normal activities when you're told. Ask what things are safe for you to do. General instructions Take your medicines only as told by your provider. Contact a health care provider if: Your medicines don't help or make your vertigo worse. You get new symptoms. You have a fever. You have nausea or vomiting. Your family or friends spot any changes in how you're acting. A part of your body goes numb. You feel tingling and prickling in a part of your body. You get very bad headaches. Get help right  away if: You're always dizzy or you faint. You have a stiff neck. You have trouble moving or speaking. Your hands, arms, or legs feel weak. Your hearing or eyesight changes. These symptoms may be an emergency. Call 911 right away. Do not wait to see if the symptoms will go away. Do not drive yourself to the hospital. This information is not intended to replace advice given to you by your health care provider. Make sure you discuss any questions you have with your health care provider. Document Revised: 04/23/2022 Document Reviewed: 04/23/2022 Elsevier Patient Education  2024 [employer].

## 2022-12-07 NOTE — Assessment & Plan Note (Signed)
Zoloft at 50 mg is helping. Lower does not help. Can continue with Alprazolam

## 2022-12-07 NOTE — Assessment & Plan Note (Signed)
Worsening over time. 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 Encouraged moist heat and gentle stretching as tolerated. May try NSAIDs and prescription meds as directed and report if symptoms worsen or seek immediate care

## 2022-12-10 ENCOUNTER — Encounter: Payer: Self-pay | Admitting: *Deleted

## 2023-06-21 ENCOUNTER — Encounter: Payer: 59 | Admitting: Family Medicine

## 2023-07-05 ENCOUNTER — Other Ambulatory Visit: Payer: Self-pay | Admitting: Family Medicine

## 2023-07-20 ENCOUNTER — Ambulatory Visit: Admitting: Family Medicine

## 2023-07-20 ENCOUNTER — Encounter: Payer: Self-pay | Admitting: Family Medicine

## 2023-07-20 VITALS — BP 122/74 | HR 66 | Temp 98.0°F | Resp 16 | Ht 67.0 in | Wt 137.0 lb

## 2023-07-20 DIAGNOSIS — H9201 Otalgia, right ear: Secondary | ICD-10-CM | POA: Diagnosis not present

## 2023-07-20 MED ORDER — NEOMYCIN-POLYMYXIN-HC 3.5-10000-1 OT SOLN: 3.0000 [drp] | Freq: Three times a day (TID) | OTIC | 0 refills | Status: AC

## 2023-07-20 NOTE — Patient Instructions (Signed)
 Use a cotton ball for 10 min after putting in the drops.   OK to take Tylenol 1000 mg (2 extra strength tabs) or 975 mg (3 regular strength tabs) every 6 hours as needed.  Send me a message over the weekend if no significant improvement.   Let us  know if you need anything.

## 2023-07-20 NOTE — Progress Notes (Signed)
 Chief Complaint  Patient presents with   Ear Pain    R Ear Pain    Pt is here for right ear pain. Duration: a few days Progression: unchanged Associated symptoms: swelling, coryza and sneezing Denies: congestion, fevers, trauma, jaw pain, bleeding, or discharge from ear Treatment to date: Expired drops  Past Medical History:  Diagnosis Date   Abdominal pain 09/15/2014   Anxiety and depression 06/01/2016   Asthma    as a child   Cervical cancer screening 09/03/2014   Chicken pox as a child   Diabetes mellitus without complication (HCC) 2010   gestational diabetes- first pregnancy- not in the last 2 pregnancies   Dysuria 12/09/2013   Fatigue 11/28/2015   Grief reaction 11/28/2015   Hyperlipidemia 12/09/2013   Insomnia 12/09/2013   Preventative health care 09/03/2014   Spondylolisthesis 12/09/2013   Sun-damaged skin 09/15/2014   Throat irritation 12/09/2013    BP 122/74 (BP Location: Left Arm, Patient Position: Sitting)   Pulse 66   Temp 98 F (36.7 C) (Oral)   Resp 16   Ht 5' 7 (1.702 m)   Wt 137 lb (62.1 kg)   SpO2 98%   BMI 21.46 kg/m  General: Awake, alert, appearing stated age HEENT:  L ear- Canal patent without drainage or erythema, TM is negative R ear- canal patent with there drainage, no erythema, TM is negative Nose- nares patent and without discharge Mouth- Lips, gums and dentition unremarkable, pharynx is without erythema or exudate MSK: No TMJ TTP Neck: No adenopathy Lungs: Normal effort, no accessory muscle use Psych: Age appropriate judgment and insight, normal mood and affect  Right ear pain - Plan: neomycin -polymyxin-hydrocortisone (CORTISPORIN) OTIC solution  Sounds like she has recurrent otitis externa and responds well to Cortisporin.  Will resend.  Use cotton ball after placing drops 3 times daily.  Send message over weekend if not having significant improvement.  Would consider treating for eustachian tube dysfunction. F/u prn.  Pt voiced  understanding and agreement to the plan.  Shellie Dials Hinton, DO 07/20/23 3:33 PM

## 2023-07-22 ENCOUNTER — Encounter: Payer: Self-pay | Admitting: Family Medicine

## 2023-07-22 ENCOUNTER — Other Ambulatory Visit: Payer: Self-pay | Admitting: Family Medicine

## 2023-07-22 MED ORDER — PREDNISONE 20 MG PO TABS: 40.0000 mg | ORAL_TABLET | Freq: Every day | ORAL | 0 refills | Status: AC

## 2023-12-04 NOTE — Assessment & Plan Note (Signed)
 On Sertraline  and has Alprazolam  to use prn but does not need to use

## 2023-12-04 NOTE — Assessment & Plan Note (Signed)
 Patient encouraged to maintain heart healthy diet, regular exercise, adequate sleep. Consider daily probiotics. Take medications as prescribed. Labs ordered and reviewed. Given and reviewed copy of ACP documents from [employer] and encouraged to complete and return. MGM 10/2022 repeat this year.  Cologuard 04/29/2021 Pap March 2023 repeat in 2026-28

## 2023-12-04 NOTE — Progress Notes (Unsigned)
 Subjective:    Patient ID: Cynthia Blanchard, female    DOB: [birth year], 47 y.o.   MRN: [id number]  No chief complaint on file.   HPI Discussed the use of AI scribe software for clinical note transcription with the patient, who gave verbal consent to proceed.  History of Present Illness Cynthia Blanchard is a 48 year old female who presents with irregular menstrual cycles and left hip pain.  She has been experiencing irregular menstrual cycles for the first time this month. Her cycles have been regular for years, occurring every 20 to 32 days, but she recently went seven weeks without a period, followed by a week of menstruation, a week without, and then another full week of menstruation. She experiences fatigue, bloating, and requires two naps a day. She also reports more aches and pains, sluggishness. Her mother entered menopause in her mid-fifties, and she has an aunt with uterine cancer.  She reports left hip pain that has been present for the last few years, worsening to the point where she has trouble moving. The pain is described as 'pinchy' and located in the left groin, particularly noticeable when hiking. She used to run and suspects it might be an impingement. 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  She also mentions dealing with neck pain over the past year, which improved after seeing a chiropractor. She takes ibuprofen every morning, three tablets, to manage the pain and stiffness. She has tried meloxicam in the past, which upset her stomach. She uses a heating pad in the morning to help with neck stiffness and has a Biofreeze roller for topical relief.  She has a history of taking  and expeSertraline and experiences some morning bowel issues, which she finds tolerable. She is aware of the importance of fiber in her diet and has been taking psyllium capsules to help with bowel regularity.    Past Medical History:   Diagnosis Date   Abdominal pain 09/15/2014   Anxiety and depression 06/01/2016   Asthma    as a child   Cervical cancer screening 09/03/2014   Chicken pox as a child   Diabetes mellitus without complication (HCC) 2010   gestational diabetes- first pregnancy- not in the last 2 pregnancies   Dysuria 12/09/2013   Fatigue 11/28/2015   Grief reaction 11/28/2015   Hyperlipidemia 12/09/2013   Insomnia 12/09/2013   Preventative health care 09/03/2014   Spondylolisthesis 12/09/2013   Sun-damaged skin 09/15/2014   Throat irritation 12/09/2013    Past Surgical History:  Procedure Laterality Date   DILATION AND CURETTAGE OF UTERUS  2012   FOOT SURGERY  8-10   right- cyst- size of a golf ball   tube removed  2012   right tube   tube ressected  2009   right tube    Family History  Problem Relation Age of Onset   Hypertension Mother    Obesity Father    Depression Father    Mental illness Brother    HIV Brother    Drug abuse Brother    Kidney disease Brother    Other Brother        Glomerular Nephritis   Cancer Maternal Grandmother        skin   Other Maternal Grandmother        valve replacement   Cancer Maternal Grandfather        colon   Diabetes Paternal Grandmother  type 2   Stroke Paternal Grandmother    Cancer Paternal Grandfather        prostate    Social History   Socioeconomic History   Marital status: Married    Spouse name: Not on file   Number of children: Not on file   Years of education: Not on file   Highest education level: Master's degree (e.g., MA, MS, MEng, MEd, MSW, MBA)  Occupational History   Not on file  Tobacco Use   Smoking status: Never   Smokeless tobacco: Never  Substance and Sexual Activity   Alcohol use: Yes    Alcohol/week: 0.0 standard drinks of alcohol    Comment: glass of wine every night   Drug use: No   Sexual activity: Yes    Partners: Male    Comment: live with husband and 3 children. works prn for Physical therapy, no  dietarty restirctions  Other Topics Concern   Not on file  Social History Narrative   Works as a adult nurse   Social Drivers of Corporate Investment Banker Strain: Low Risk  (11/28/2023)   Overall Financial Resource Strain (CARDIA)    Difficulty of Paying Living Expenses: Not very hard  Food Insecurity: No Food Insecurity (11/28/2023)   Hunger Vital Sign    Worried About Running Out of Food in the Last Year: Never true    Ran Out of Food in the Last Year: Never true  Transportation Needs: No Transportation Needs (11/28/2023)   PRAPARE - Administrator, Civil Service (Medical): No    Lack of Transportation (Non-Medical): No  Physical Activity: Sufficiently Active (11/28/2023)   Exercise Vital Sign    Days of Exercise per Week: 3 days    Minutes of Exercise per Session: 60 min  Stress: No Stress Concern Present (11/28/2023)   [employer] of Occupational Health - Occupational Stress Questionnaire    Feeling of Stress: Only a little  Social Connections: Unknown (11/28/2023)   Social Connection and Isolation Panel    Frequency of Communication with Friends and Family: Patient declined    Frequency of Social Gatherings with Friends and Family: Patient declined    Attends Religious Services: Never    Database Administrator or Organizations: No    Attends Engineer, Structural: Not on file    Marital Status: Married  Catering Manager Violence: Not on file    Outpatient Medications Prior to Visit  Medication Sig Dispense Refill   ALPRAZolam  (XANAX ) 0.5 MG tablet Take 1 tablet (0.5 mg total) by mouth 2 (two) times daily as needed for anxiety. 30 tablet 1   hyoscyamine  (LEVSIN  SL) 0.125 MG SL tablet Place 1 tablet (0.125 mg total) under the tongue every 6 (six) hours as needed. 30 tablet 1   neomycin -polymyxin-hydrocortisone (CORTISPORIN) OTIC solution Place 3 drops into the right ear 3 (three) times daily. 10 mL 0   sertraline  (ZOLOFT ) 50 MG tablet  TAKE 1 TABLET BY MOUTH EVERY DAY 90 tablet 1   tiZANidine  (ZANAFLEX ) 2 MG tablet Take 0.5-2 tablets (1-4 mg total) by mouth every 8 (eight) hours as needed for muscle spasms. 40 tablet 1   No facility-administered medications prior to visit.    Allergies  Allergen Reactions   Latex Hives   Penicillins Hives    Review of Systems  Constitutional:  Positive for malaise/fatigue. Negative for chills and fever.  HENT:  Negative for congestion and hearing loss.   Eyes:  Negative for discharge.  Respiratory:  Negative for cough, sputum production and shortness of breath.   Cardiovascular:  Negative for chest pain, palpitations and leg swelling.  Gastrointestinal:  Positive for abdominal pain. Negative for blood in stool, constipation, diarrhea, heartburn, nausea and vomiting.  Genitourinary:  Negative for dysuria, frequency, hematuria and urgency.  Musculoskeletal:  Positive for joint pain. Negative for back pain, falls and myalgias.  Skin:  Negative for rash.  Neurological:  Negative for dizziness, sensory change, loss of consciousness, weakness and headaches.  Endo/Heme/Allergies:  Negative for environmental allergies. Does not bruise/bleed easily.  Psychiatric/Behavioral:  Negative for depression and suicidal ideas. The patient is not nervous/anxious and does not have insomnia.        Objective:    Physical Exam Constitutional:      General: She is not in acute distress.    Appearance: Normal appearance. She is not diaphoretic.  HENT:     Head: Normocephalic and atraumatic.     Right Ear: Tympanic membrane, ear canal and external ear normal.     Left Ear: Tympanic membrane, ear canal and external ear normal.     Nose: Nose normal.     Mouth/Throat:     Mouth: Mucous membranes are moist.     Pharynx: Oropharynx is clear. No oropharyngeal exudate.  Eyes:     General: No scleral icterus.       Right eye: No discharge.        Left eye: No discharge.     Conjunctiva/sclera:  Conjunctivae normal.     Pupils: Pupils are equal, round, and reactive to light.  Neck:     Thyroid : No thyromegaly.  Cardiovascular:     Rate and Rhythm: Normal rate and regular rhythm.     Heart sounds: Normal heart sounds. No murmur heard. Pulmonary:     Effort: Pulmonary effort is normal. No respiratory distress.     Breath sounds: Normal breath sounds. No wheezing or rales.  Abdominal:     General: Bowel sounds are normal. There is no distension.     Palpations: Abdomen is soft. There is no mass.     Tenderness: There is no abdominal tenderness.  Musculoskeletal:        General: No tenderness. Normal range of motion.     Cervical back: Normal range of motion and neck supple.  Lymphadenopathy:     Cervical: No cervical adenopathy.  Skin:    General: Skin is warm and dry.     Findings: No rash.  Neurological:     General: No focal deficit present.     Mental Status: She is alert and oriented to person, place, and time.     Cranial Nerves: No cranial nerve deficit.     Coordination: Coordination normal.     Deep Tendon Reflexes: Reflexes are normal and symmetric. Reflexes normal.  Psychiatric:        Mood and Affect: Mood normal.        Behavior: Behavior normal.        Thought Content: Thought content normal.        Judgment: Judgment normal.    There were no vitals taken for this visit. Wt Readings from Last 3 Encounters:  07/20/23 137 lb (62.1 kg)  12/07/22 134 lb 3.2 oz (60.9 kg)  05/17/22 137 lb (62.1 kg)    Diabetic Foot Exam - Simple   No data filed    Lab Results  Component Value Date   WBC 6.0 05/17/2022   HGB 12.5  05/17/2022   HCT 37.3 05/17/2022   PLT 301.0 05/17/2022   GLUCOSE 84 12/07/2022   CHOL 159 11/09/2021   TRIG 49.0 11/09/2021   HDL 83.70 11/09/2021   LDLCALC 65 11/09/2021   ALT 15 12/07/2022   AST 23 12/07/2022   NA 139 12/07/2022   K 4.0 12/07/2022   CL 103 12/07/2022   CREATININE 0.61 12/07/2022   BUN 12 12/07/2022   CO2 28  12/07/2022   TSH 0.95 05/17/2022   HGBA1C 5.2 05/17/2022    Lab Results  Component Value Date   TSH 0.95 05/17/2022   Lab Results  Component Value Date   WBC 6.0 05/17/2022   HGB 12.5 05/17/2022   HCT 37.3 05/17/2022   MCV 96.3 05/17/2022   PLT 301.0 05/17/2022   Lab Results  Component Value Date   NA 139 12/07/2022   K 4.0 12/07/2022   CO2 28 12/07/2022   GLUCOSE 84 12/07/2022   BUN 12 12/07/2022   CREATININE 0.61 12/07/2022   BILITOT 0.8 12/07/2022   ALKPHOS 49 12/07/2022   AST 23 12/07/2022   ALT 15 12/07/2022   PROT 7.2 12/07/2022   ALBUMIN 4.6 12/07/2022   CALCIUM 9.8 12/07/2022   GFR 106.95 12/07/2022   Lab Results  Component Value Date   CHOL 159 11/09/2021   Lab Results  Component Value Date   HDL 83.70 11/09/2021   Lab Results  Component Value Date   LDLCALC 65 11/09/2021   Lab Results  Component Value Date   TRIG 49.0 11/09/2021   Lab Results  Component Value Date   CHOLHDL 2 11/09/2021   Lab Results  Component Value Date   HGBA1C 5.2 05/17/2022       Assessment & Plan:  Hyperglycemia Assessment & Plan: hgba1c acceptable, minimize simple carbs. Increase exercise as tolerated.    Other hyperlipidemia Assessment & Plan: Encourage heart healthy diet such as MIND or DASH diet, increase exercise, avoid trans fats, simple carbohydrates and processed foods, consider a krill or fish or flaxseed oil cap daily.     Anxiety Assessment & Plan: On Sertraline  and has Alprazolam  to use prn but does not need to use   Preventative health care Assessment & Plan: Patient encouraged to maintain heart healthy diet, regular exercise, adequate sleep. Consider daily probiotics. Take medications as prescribed. Labs ordered and reviewed. Given and reviewed copy of ACP documents from [employer] and encouraged to complete and return. MGM 10/2022 repeat this year.  Cologuard 04/29/2021 Pap March 2023 repeat in 2026-28      Assessment and  Plan Assessment & Plan Adult Wellness Visit Routine wellness visit with discussion on aging, lifestyle modifications, and preventive care. Emphasized the importance of movement, sleep, hydration, and protein intake for optimal health in the 60. - Ordered mammogram for January 5th, 2026 or later - Scheduled follow-up in six months for f/u check - Scheduled one-year physical exam  Irregular menstruation and perimenopausal symptoms Irregular menstrual cycles with recent seven-week gap followed by two consecutive cycles. Symptoms include fatigue, brain fog, and abdominal bloating. Differential includes perimenopause, fibroids, or ovarian cysts. Family history of uterine cancer noted. Discussed potential for oral contraceptives to regulate cycles if symptoms persist. - Ordered pelvic ultrasound to evaluate for fibroids or ovarian cysts - Will consider lab work if symptoms escalate - Will discuss potential referral to GYN if needed  Left hip pain Chronic left hip pain, worsening over the past few years, possibly due to arthritis or impingement. Discussed  referral to sports medicine for further evaluation and management. - Ordered x-ray of left hip - Referred to sports medicine for further evaluation and management  Neck pain with chronic NSAID use Chronic neck pain with moderate to severe arthritis, likely exacerbated by past athletic activities. Current management includes ibuprofen and heat therapy. Discussed risks of NSAID use and alternative options. Advised to limit ibuprofen to two tablets daily with food to minimize side effects. - Advised to limit ibuprofen to two tablets daily with food - Consider naproxen or Celebrex as alternatives - Continue heat therapy for morning stiffness  Abdominal bloating and distension Intermittent abdominal bloating and distension, possibly related to hormonal changes. No significant changes in bowel habits or blood in stool. Discussed potential benefits of  fiber supplementation. - Consider fiber supplementation with Benafiber or Metamucil  Anxiety and depression Managed with alprazolam  and sertraline . Discussed minimal use of alprazolam  and potential impact on memory. Emphasized importance of regular follow-up for medication management. - Continue alprazolam  and sertraline  - Scheduled six-month follow-up for medication review  Type 2 diabetes mellitus without complications Type 2 diabetes mellitus without complications. Discussed importance of monitoring blood glucose levels and maintaining a healthy lifestyle. - Ordered A1c test to monitor long-term glucose control  Other hyperlipidemia Hyperlipidemia.  Premenstrual dysphoric disorder (PMDD) PMDD with emotional symptoms preceding menstruation. Discussed potential for oral contraceptives to manage symptoms if irregular cycles persist. - Will consider oral contraceptives if irregular cycles persist  Recording duration: 38 minutes     Harlene Horton, MD

## 2023-12-04 NOTE — Assessment & Plan Note (Signed)
 Encourage heart healthy diet such as MIND or DASH diet, increase exercise, avoid trans fats, simple carbohydrates and processed foods, consider a krill or fish or flaxseed oil cap daily.

## 2023-12-04 NOTE — Assessment & Plan Note (Signed)
 hgba1c acceptable, minimize simple carbs. Increase exercise as tolerated.

## 2023-12-05 ENCOUNTER — Ambulatory Visit: Admitting: Family Medicine

## 2023-12-05 ENCOUNTER — Encounter: Payer: Self-pay | Admitting: Family Medicine

## 2023-12-05 ENCOUNTER — Ambulatory Visit (HOSPITAL_BASED_OUTPATIENT_CLINIC_OR_DEPARTMENT_OTHER)
Admission: RE | Admit: 2023-12-05 | Discharge: 2023-12-05 | Disposition: A | Discharge status: Home / Self Care | Source: Ambulatory Visit | Attending: Family Medicine | Admitting: Family Medicine

## 2023-12-05 VITALS — BP 118/74 | HR 60 | Temp 97.1°F | Resp 16 | Ht 67.0 in | Wt 142.6 lb

## 2023-12-05 DIAGNOSIS — M25552 Pain in left hip: Secondary | ICD-10-CM

## 2023-12-05 DIAGNOSIS — N926 Irregular menstruation, unspecified: Secondary | ICD-10-CM

## 2023-12-05 DIAGNOSIS — Z79899 Other long term (current) drug therapy: Secondary | ICD-10-CM

## 2023-12-05 DIAGNOSIS — R140 Abdominal distension (gaseous): Secondary | ICD-10-CM

## 2023-12-05 DIAGNOSIS — Z1231 Encounter for screening mammogram for malignant neoplasm of breast: Secondary | ICD-10-CM | POA: Diagnosis not present

## 2023-12-05 DIAGNOSIS — R739 Hyperglycemia, unspecified: Secondary | ICD-10-CM | POA: Diagnosis not present

## 2023-12-05 DIAGNOSIS — Z0000 Encounter for general adult medical examination without abnormal findings: Secondary | ICD-10-CM | POA: Diagnosis not present

## 2023-12-05 DIAGNOSIS — E7849 Other hyperlipidemia: Secondary | ICD-10-CM | POA: Diagnosis not present

## 2023-12-05 DIAGNOSIS — F419 Anxiety disorder, unspecified: Secondary | ICD-10-CM | POA: Diagnosis not present

## 2023-12-05 MED ORDER — ALPRAZOLAM 0.5 MG PO TABS: 0.5000 mg | ORAL_TABLET | Freq: Two times a day (BID) | ORAL | 1 refills | Status: AC | PRN

## 2023-12-05 NOTE — Addendum Note (Signed)
 Addended by: TRUDY CURVIN RAMAN on: 12/05/2023 03:39 PM   Modules accepted: Orders

## 2023-12-05 NOTE — Patient Instructions (Signed)
 Preventive Care 58-47 Years Old, Female  Preventive care refers to lifestyle choices and visits with your health care provider that can promote health and wellness. Preventive care visits are also called wellness exams.  What can I expect for my preventive care visit?  Counseling  Your health care provider may ask you questions about your:  Medical history, including:  Past medical problems.  Family medical history.  Pregnancy history.  Current health, including:  Menstrual cycle.  Method of birth control.  Emotional well-being.  Home life and relationship well-being.  Sexual activity and sexual health.  Lifestyle, including:  Alcohol, nicotine or tobacco, and drug use.  Access to firearms.  Diet, exercise, and sleep habits.  Work and work Astronomer.  Sunscreen use.  Safety issues such as seatbelt and bike helmet use.  Physical exam  Your health care provider will check your:  Height and weight. These may be used to calculate your BMI (body mass index). BMI is a measurement that tells if you are at a healthy weight.  Waist circumference. This measures the distance around your waistline. This measurement also tells if you are at a healthy weight and may help predict your risk of certain diseases, such as type 2 diabetes and high blood pressure.  Heart rate and blood pressure.  Body temperature.  Skin for abnormal spots.  What immunizations do I need?    Vaccines are usually given at various ages, according to a schedule. Your health care provider will recommend vaccines for you based on your age, medical history, and lifestyle or other factors, such as travel or where you work.  What tests do I need?  Screening  Your health care provider may recommend screening tests for certain conditions. This may include:  Lipid and cholesterol levels.  Diabetes screening. This is done by checking your blood sugar (glucose) after you have not eaten for a while (fasting).  Pelvic exam and Pap test.  Hepatitis B test.  Hepatitis C  test.  HIV (human immunodeficiency virus) test.  STI (sexually transmitted infection) testing, if you are at risk.  Lung cancer screening.  Colorectal cancer screening.  Mammogram. Talk with your health care provider about when you should start having regular mammograms. This may depend on whether you have a family history of breast cancer.  BRCA-related cancer screening. This may be done if you have a family history of breast, ovarian, tubal, or peritoneal cancers.  Bone density scan. This is done to screen for osteoporosis.  Talk with your health care provider about your test results, treatment options, and if necessary, the need for more tests.  Follow these instructions at home:  Eating and drinking    Eat a diet that includes fresh fruits and vegetables, whole grains, lean protein, and low-fat dairy products.  Take vitamin and mineral supplements as recommended by your health care provider.  Do not drink alcohol if:  Your health care provider tells you not to drink.  You are pregnant, may be pregnant, or are planning to become pregnant.  If you drink alcohol:  Limit how much you have to 0-1 drink a day.  Know how much alcohol is in your drink. In the [place] one drink equals one 12 oz bottle of beer (355 mL), one 5 oz glass of wine (148 mL), or one 1 oz glass of hard liquor (44 mL).  Lifestyle  Brush your teeth every morning and night with fluoride toothpaste. Floss one time each day.  Exercise for at least  30 minutes 5 or more days each week.  Do not use any products that contain nicotine or tobacco. These products include cigarettes, chewing tobacco, and vaping devices, such as e-cigarettes. If you need help quitting, ask your health care provider.  Do not use drugs.  If you are sexually active, practice safe sex. Use a condom or other form of protection to prevent STIs.  If you do not wish to become pregnant, use a form of birth control. If you plan to become pregnant, see your health care provider for a  prepregnancy visit.  Take aspirin only as told by your health care provider. Make sure that you understand how much to take and what form to take. Work with your health care provider to find out whether it is safe and beneficial for you to take aspirin daily.  Find healthy ways to manage stress, such as:  Meditation, yoga, or listening to music.  Journaling.  Talking to a trusted person.  Spending time with friends and family.  Minimize exposure to UV radiation to reduce your risk of skin cancer.  Safety  Always wear your seat belt while driving or riding in a vehicle.  Do not drive:  If you have been drinking alcohol. Do not ride with someone who has been drinking.  When you are tired or distracted.  While texting.  If you have been using any mind-altering substances or drugs.  Wear a helmet and other protective equipment during sports activities.  If you have firearms in your house, make sure you follow all gun safety procedures.  Seek help if you have been physically or sexually abused.  What's next?  Visit your health care provider once a year for an annual wellness visit.  Ask your health care provider how often you should have your eyes and teeth checked.  Stay up to date on all vaccines.  This information is not intended to replace advice given to you by your health care provider. Make sure you discuss any questions you have with your health care provider.  Document Revised: 07/16/2020 Document Reviewed: 07/16/2020  Elsevier Patient Education  2024 [employer].

## 2023-12-08 ENCOUNTER — Ambulatory Visit: Payer: Self-pay | Admitting: Family Medicine

## 2023-12-08 ENCOUNTER — Encounter: Payer: Self-pay | Admitting: Family Medicine

## 2023-12-08 ENCOUNTER — Other Ambulatory Visit (INDEPENDENT_AMBULATORY_CARE_PROVIDER_SITE_OTHER)

## 2023-12-08 DIAGNOSIS — Z79899 Other long term (current) drug therapy: Secondary | ICD-10-CM

## 2023-12-08 DIAGNOSIS — E7849 Other hyperlipidemia: Secondary | ICD-10-CM

## 2023-12-08 DIAGNOSIS — M25552 Pain in left hip: Secondary | ICD-10-CM | POA: Diagnosis not present

## 2023-12-08 DIAGNOSIS — F419 Anxiety disorder, unspecified: Secondary | ICD-10-CM

## 2023-12-08 DIAGNOSIS — R140 Abdominal distension (gaseous): Secondary | ICD-10-CM | POA: Diagnosis not present

## 2023-12-08 DIAGNOSIS — N926 Irregular menstruation, unspecified: Secondary | ICD-10-CM

## 2023-12-08 DIAGNOSIS — R739 Hyperglycemia, unspecified: Secondary | ICD-10-CM

## 2023-12-08 LAB — CBC WITH DIFFERENTIAL/PLATELET
Basophils Absolute: 0 K/uL (ref 0.0–0.1)
Basophils Relative: 0.9 % (ref 0.0–3.0)
Eosinophils Absolute: 0.1 K/uL (ref 0.0–0.7)
Eosinophils Relative: 1.4 % (ref 0.0–5.0)
HCT: 41.8 % (ref 36.0–46.0)
Hemoglobin: 13.9 g/dL (ref 12.0–15.0)
Lymphocytes Relative: 32.9 % (ref 12.0–46.0)
Lymphs Abs: 1.5 K/uL (ref 0.7–4.0)
MCHC: 33.2 g/dL (ref 30.0–36.0)
MCV: 95.5 fl (ref 78.0–100.0)
Monocytes Absolute: 0.4 K/uL (ref 0.1–1.0)
Monocytes Relative: 8.6 % (ref 3.0–12.0)
Neutro Abs: 2.6 K/uL (ref 1.4–7.7)
Neutrophils Relative %: 56.2 % (ref 43.0–77.0)
Platelets: 272 K/uL (ref 150.0–400.0)
RBC: 4.38 Mil/uL (ref 3.87–5.11)
RDW: 13.7 % (ref 11.5–15.5)
WBC: 4.7 K/uL (ref 4.0–10.5)

## 2023-12-08 LAB — TSH: TSH: 1.16 u[IU]/mL (ref 0.35–5.50)

## 2023-12-08 LAB — LIPID PANEL
Cholesterol: 186 mg/dL (ref 0–200)
HDL: 101.3 mg/dL (ref >39.00)
LDL Cholesterol: 75 mg/dL (ref 0–99)
NonHDL: 84.89
Total CHOL/HDL Ratio: 2
Triglycerides: 51 mg/dL (ref 0.0–149.0)
VLDL: 10.2 mg/dL (ref 0.0–40.0)

## 2023-12-08 LAB — COMPREHENSIVE METABOLIC PANEL WITH GFR
ALT: 18 U/L (ref 0–35)
AST: 27 U/L (ref 0–37)
Albumin: 4.7 g/dL (ref 3.5–5.2)
Alkaline Phosphatase: 53 U/L (ref 39–117)
BUN: 15 mg/dL (ref 6–23)
CO2: 28 meq/L (ref 19–32)
Calcium: 9.3 mg/dL (ref 8.4–10.5)
Chloride: 101 meq/L (ref 96–112)
Creatinine, Ser: 0.56 mg/dL (ref 0.40–1.20)
GFR: 108.41 mL/min (ref >60.00)
Glucose, Bld: 79 mg/dL (ref 70–99)
Potassium: 4.3 meq/L (ref 3.5–5.1)
Sodium: 138 meq/L (ref 135–145)
Total Bilirubin: 0.9 mg/dL (ref 0.2–1.2)
Total Protein: 7.2 g/dL (ref 6.0–8.3)

## 2023-12-08 LAB — HEMOGLOBIN A1C: Hgb A1c MFr Bld: 5.3 % (ref 4.6–6.5)

## 2023-12-10 LAB — DM TEMPLATE

## 2023-12-11 LAB — DRUG MONITORING PANEL 376104, URINE
Alphahydroxyalprazolam: NEGATIVE ng/mL (ref <25)
Alphahydroxymidazolam: NEGATIVE ng/mL (ref <50)
Alphahydroxytriazolam: NEGATIVE ng/mL (ref <50)
Aminoclonazepam: NEGATIVE ng/mL (ref <25)
Benzodiazepines: NEGATIVE ng/mL (ref <100)
Cocaine Metabolite: NEGATIVE ng/mL (ref <150)
Desmethyltramadol: NEGATIVE ng/mL (ref <100)
Hydroxyethylflurazepam: NEGATIVE ng/mL (ref <50)
Lorazepam: NEGATIVE ng/mL (ref <50)
Nordiazepam: NEGATIVE ng/mL (ref <50)
Opiates: NEGATIVE ng/mL (ref <100)
Oxazepam: NEGATIVE ng/mL (ref <50)
Oxycodone: NEGATIVE ng/mL (ref <100)
Temazepam: NEGATIVE ng/mL (ref <50)
Tramadol: NEGATIVE ng/mL (ref <100)
Tramadol: NEGATIVE ng/mL (ref <500)

## 2023-12-11 LAB — DM TEMPLATE

## 2023-12-12 ENCOUNTER — Other Ambulatory Visit (HOSPITAL_BASED_OUTPATIENT_CLINIC_OR_DEPARTMENT_OTHER)

## 2023-12-15 ENCOUNTER — Ambulatory Visit (HOSPITAL_BASED_OUTPATIENT_CLINIC_OR_DEPARTMENT_OTHER)

## 2023-12-15 ENCOUNTER — Encounter (HOSPITAL_BASED_OUTPATIENT_CLINIC_OR_DEPARTMENT_OTHER): Payer: Self-pay

## 2024-01-01 ENCOUNTER — Other Ambulatory Visit: Payer: Self-pay | Admitting: Family Medicine

## 2024-04-02 ENCOUNTER — Ambulatory Visit (HOSPITAL_BASED_OUTPATIENT_CLINIC_OR_DEPARTMENT_OTHER)

## 2024-04-05 ENCOUNTER — Ambulatory Visit (HOSPITAL_BASED_OUTPATIENT_CLINIC_OR_DEPARTMENT_OTHER)

## 2024-06-04 ENCOUNTER — Ambulatory Visit: Admitting: Family Medicine

## 2024-12-06 ENCOUNTER — Encounter: Admitting: Family Medicine
